# Patient Record
Sex: Female | Born: 1970 | Race: Black or African American | Hispanic: No | Marital: Single | Smoking: Never smoker
Health system: Southern US, Community
[De-identification: ages and names within clinical notes are randomized; demographics above are authoritative.]

## PROBLEM LIST (undated history)

## (undated) DIAGNOSIS — R609 Edema, unspecified: Secondary | ICD-10-CM

## (undated) DIAGNOSIS — J45909 Unspecified asthma, uncomplicated: Secondary | ICD-10-CM

## (undated) DIAGNOSIS — M1990 Unspecified osteoarthritis, unspecified site: Secondary | ICD-10-CM

## (undated) DIAGNOSIS — E785 Hyperlipidemia, unspecified: Secondary | ICD-10-CM

## (undated) DIAGNOSIS — E119 Type 2 diabetes mellitus without complications: Secondary | ICD-10-CM

## (undated) DIAGNOSIS — Z8719 Personal history of other diseases of the digestive system: Secondary | ICD-10-CM

## (undated) DIAGNOSIS — G629 Polyneuropathy, unspecified: Secondary | ICD-10-CM

## (undated) DIAGNOSIS — R200 Anesthesia of skin: Secondary | ICD-10-CM

## (undated) DIAGNOSIS — J42 Unspecified chronic bronchitis: Secondary | ICD-10-CM

## (undated) DIAGNOSIS — M2550 Pain in unspecified joint: Secondary | ICD-10-CM

## (undated) DIAGNOSIS — M2540 Effusion, unspecified joint: Secondary | ICD-10-CM

## (undated) DIAGNOSIS — T7840XA Allergy, unspecified, initial encounter: Secondary | ICD-10-CM

## (undated) DIAGNOSIS — I10 Essential (primary) hypertension: Secondary | ICD-10-CM

## (undated) HISTORY — PX: OTHER SURGICAL HISTORY: SHX169

---

## 2000-06-29 HISTORY — PX: ABDOMINAL HYSTERECTOMY: SHX81

## 2010-06-29 HISTORY — PX: HEMORRHOID SURGERY: SHX153

## 2012-06-29 HISTORY — PX: FOOT SURGERY: SHX648

## 2013-06-29 HISTORY — PX: BLADDER SURGERY: SHX569

## 2014-06-29 HISTORY — PX: OTHER SURGICAL HISTORY: SHX169

## 2016-02-07 ENCOUNTER — Encounter (HOSPITAL_COMMUNITY): Payer: Self-pay

## 2016-02-07 ENCOUNTER — Emergency Department (HOSPITAL_COMMUNITY)
Admission: EM | Admit: 2016-02-07 | Discharge: 2016-02-07 | Disposition: A | Discharge status: Home / Self Care | Payer: Medicaid - Out of State | Attending: Emergency Medicine | Admitting: Emergency Medicine

## 2016-02-07 DIAGNOSIS — R109 Unspecified abdominal pain: Secondary | ICD-10-CM | POA: Insufficient documentation

## 2016-02-07 DIAGNOSIS — Z7982 Long term (current) use of aspirin: Secondary | ICD-10-CM | POA: Diagnosis not present

## 2016-02-07 DIAGNOSIS — Z794 Long term (current) use of insulin: Secondary | ICD-10-CM | POA: Diagnosis not present

## 2016-02-07 DIAGNOSIS — Z79899 Other long term (current) drug therapy: Secondary | ICD-10-CM | POA: Insufficient documentation

## 2016-02-07 DIAGNOSIS — I10 Essential (primary) hypertension: Secondary | ICD-10-CM | POA: Diagnosis not present

## 2016-02-07 DIAGNOSIS — E119 Type 2 diabetes mellitus without complications: Secondary | ICD-10-CM | POA: Diagnosis not present

## 2016-02-07 HISTORY — DX: Essential (primary) hypertension: I10

## 2016-02-07 HISTORY — DX: Type 2 diabetes mellitus without complications: E11.9

## 2016-02-07 LAB — URINALYSIS, ROUTINE W REFLEX MICROSCOPIC
BILIRUBIN URINE: NEGATIVE
Glucose, UA: NEGATIVE mg/dL
Hgb urine dipstick: NEGATIVE
KETONES UR: NEGATIVE mg/dL
LEUKOCYTES UA: NEGATIVE
NITRITE: NEGATIVE
PROTEIN: NEGATIVE mg/dL
Specific Gravity, Urine: 1.016 (ref 1.005–1.030)
pH: 7 (ref 5.0–8.0)

## 2016-02-07 LAB — BASIC METABOLIC PANEL
ANION GAP: 10 (ref 5–15)
BUN: 12 mg/dL (ref 6–20)
CALCIUM: 9.7 mg/dL (ref 8.9–10.3)
CO2: 25 mmol/L (ref 22–32)
CREATININE: 0.67 mg/dL (ref 0.44–1.00)
Chloride: 101 mmol/L (ref 101–111)
Glucose, Bld: 89 mg/dL (ref 65–99)
Potassium: 4.2 mmol/L (ref 3.5–5.1)
SODIUM: 136 mmol/L (ref 135–145)

## 2016-02-07 LAB — CBC
HCT: 40.2 % (ref 36.0–46.0)
Hemoglobin: 13.2 g/dL (ref 12.0–15.0)
MCH: 29.1 pg (ref 26.0–34.0)
MCHC: 32.8 g/dL (ref 30.0–36.0)
MCV: 88.5 fL (ref 78.0–100.0)
PLATELETS: 370 10*3/uL (ref 150–400)
RBC: 4.54 MIL/uL (ref 3.87–5.11)
RDW: 13.1 % (ref 11.5–15.5)
WBC: 12.8 10*3/uL — AB (ref 4.0–10.5)

## 2016-02-07 LAB — I-STAT BETA HCG BLOOD, ED (MC, WL, AP ONLY)

## 2016-02-07 MED ORDER — METHOCARBAMOL 500 MG PO TABS: 500.0000 mg | ORAL_TABLET | Freq: Two times a day (BID) | ORAL | 0 refills | Status: DC

## 2016-02-07 MED ORDER — IBUPROFEN 600 MG PO TABS: 600.0000 mg | ORAL_TABLET | Freq: Four times a day (QID) | ORAL | 0 refills | Status: AC | PRN

## 2016-02-07 MED ORDER — KETOROLAC TROMETHAMINE 30 MG/ML IJ SOLN
30.0000 mg | Freq: Once | INTRAMUSCULAR | Status: AC
  Administered 2016-02-07: 30 mg via INTRAVENOUS
  Filled 2016-02-07: qty 1

## 2016-02-07 NOTE — Progress Notes (Signed)
CSW spoke with patient at bedside with no one present. Patient [facility] pain in her side. Patient reports she just moved to [place] from [place]. Patient states she has cousins in [place], however, she says they have told her "not to call and aggravate them".  Patient states she will not be able to receive her Medicaid until September and she has [facility] [place]. Patient states she would need a cab voucher to home, as she states she has problems with her knee and she uses a rolling walker. Patient asked about furniture resources. No other questions noted at this time for CSW.  CSW provided patient with a list of resources that may be able to assist with furniture, [employer], [employer], and [employer] with addresses and contact information.  Staffed with EDP and patient would benefit from cab voucher. Call made to cab company for pick up. Voucher given to Nurse.    Elenore PaddyLaVonia Yariela Tison, LCSWA [phone] ED CSW 02/07/2016 3:08 PM

## 2016-02-07 NOTE — ED Notes (Signed)
Patient asking if Social worker here yet or not.  RN called SW about patient.  SW explained she just got done with morning rounds and will be seeing patient shortly.  Rn will make patient aware.

## 2016-02-07 NOTE — ED Provider Notes (Signed)
[facility]-EMERGENCY DEPT Provider Note   CSN: [id number] Arrival date & time: 02/07/16  [id number]  First Provider Contact:  None       History   Chief Complaint Chief Complaint  Patient presents with  . Flank Pain    HPI Jennifer [name] is a 45 y.o. female.  45 year old female presents with 2 day history of persistent left-sided flank pain is worse with movement or palpation. Denies any rashes. No urinary symptoms. No fever, vomiting. No prior history of kidney stones. Denies any colicky nature to the pain. Patient doesn't relate with a walker due to prior left knee trauma. Denies any recent history of trauma. No medications used prior to arrival. Symptoms better with remaining still.      Past Medical History:  Diagnosis Date  . Diabetes (HCC)   . Hypertension     There are no active problems to display for this patient.   No past surgical history on file.  OB History    No data available       Home Medications    Prior to Admission medications   Medication Sig Start Date End Date Taking? Authorizing Provider  albuterol (PROVENTIL HFA;VENTOLIN HFA) 108 (90 Base) MCG/ACT inhaler Inhale 1-2 puffs into the lungs every 6 (six) hours as needed for wheezing or shortness of breath.   Yes Historical Provider, MD  aspirin EC 81 MG tablet Take 81 mg by mouth daily.   Yes Historical Provider, MD  atorvastatin (LIPITOR) 20 MG tablet Take 20 mg by mouth daily.   Yes Historical Provider, MD  cholecalciferol (VITAMIN D) 1000 units tablet Take 2,000 Units by mouth daily.   Yes Historical Provider, MD  fexofenadine (ALLEGRA) 180 MG tablet Take 180 mg by mouth daily.   Yes Historical Provider, MD  Fluticasone-Salmeterol (ADVAIR) 250-50 MCG/DOSE AEPB Inhale 1 puff into the lungs 2 (two) times daily.   Yes Historical Provider, MD  furosemide (LASIX) 20 MG tablet Take 20 mg by mouth daily.   Yes Historical Provider, MD  gabapentin (NEURONTIN) 300 MG capsule Take 300 mg by mouth 2 (two) times  daily.   Yes Historical Provider, MD  hydrocortisone 2.5 % cream Apply 1 application topically 2 (two) times daily.   Yes Historical Provider, MD  Insulin Glargine (BASAGLAR KWIKPEN) 100 UNIT/ML SOPN Inject 25 Units into the skin 2 (two) times daily.   Yes Historical Provider, MD  insulin lispro protamine-lispro (HUMALOG 75/25 MIX) (75-25) 100 UNIT/ML SUSP injection Inject 15 Units into the skin 3 (three) times daily.   Yes Historical Provider, MD  lidocaine (XYLOCAINE) 5 % ointment Apply 1 application topically as needed for moderate pain.   Yes Historical Provider, MD  losartan (COZAAR) 100 MG tablet Take 100 mg by mouth daily.   Yes Historical Provider, MD  metoprolol (LOPRESSOR) 50 MG tablet Take 50 mg by mouth 2 (two) times daily.   Yes Historical Provider, MD  montelukast (SINGULAIR) 10 MG tablet Take 10 mg by mouth at bedtime.   Yes Historical Provider, MD  sitaGLIPtin (JANUVIA) 100 MG tablet Take 100 mg by mouth daily.   Yes Historical Provider, MD    Family History No family history on file.  Social History Social History  Substance Use Topics  . Smoking status: Not on file  . Smokeless tobacco: Not on file  . Alcohol use Not on file     Allergies   Review of patient's allergies indicates no known allergies.   Review of Systems Review of Systems  All other systems reviewed and are negative.    Physical Exam Updated Vital Signs BP 99/69 (BP Location: Left Arm)   Pulse 90   Temp 99.1 F (37.3 C) (Oral)   Resp 20   Wt 93.9 kg   SpO2 99%   Physical Exam  Constitutional: She is oriented to person, place, and time. She appears well-developed and well-nourished.  Non-toxic appearance. No distress.  HENT:  Head: Normocephalic and atraumatic.  Eyes: Conjunctivae, EOM and lids are normal. Pupils are equal, round, and reactive to light.  Neck: Normal range of motion. Neck supple. No tracheal deviation present. No thyroid mass present.  Cardiovascular: Normal rate,  regular rhythm and normal heart sounds.  Exam reveals no gallop.   No murmur heard. Pulmonary/Chest: Effort normal and breath sounds normal. No stridor. No respiratory distress. She has no decreased breath sounds. She has no wheezes. She has no rhonchi. She has no rales.  Abdominal: Soft. Normal appearance and bowel sounds are normal. She exhibits no distension. There is no tenderness. There is no rebound and no CVA tenderness.  Musculoskeletal: Normal range of motion. She exhibits no edema or tenderness.       Right shoulder: She exhibits spasm.       Arms: Neurological: She is alert and oriented to person, place, and time. She has normal strength. No cranial nerve deficit or sensory deficit. GCS eye subscore is 4. GCS verbal subscore is 5. GCS motor subscore is 6.  Skin: Skin is warm and dry. No abrasion and no rash noted.  Psychiatric: She has a normal mood and affect. Her speech is normal and behavior is normal.  Nursing note and vitals reviewed.    ED Treatments / Results  Labs (all labs ordered are listed, but only abnormal results are displayed) Labs Reviewed  CBC - Abnormal; Notable for the following:       Result Value   WBC 12.8 (*)    All other components within normal limits  URINALYSIS, ROUTINE W REFLEX MICROSCOPIC (NOT AT [facility])  BASIC METABOLIC PANEL  I-STAT BETA HCG BLOOD, ED ([facility], [facility], [facility] ONLY)    EKG  EKG Interpretation None       Radiology No results found.  Procedures Procedures (including critical care time)  Medications Ordered in ED Medications  ketorolac (TORADOL) 30 MG/ML injection 30 mg (not administered)     Initial Impression / Assessment and Plan / ED Course  I have reviewed the triage vital signs and the nursing notes.  Pertinent labs & imaging results that were available during my care of the patient were reviewed by me and considered in my medical decision making (see chart for details).  Clinical Course  Value Comment By Time  Comment  3:        Patient medicated for pain here and has been evaluated by social worker will be given resources. Lorre Nick, MD 08/11 [phone]    Patient likely with musculoskeletal pain and stable for discharge  Final Clinical Impressions(s) / ED Diagnoses   Final diagnoses:  None    New Prescriptions New Prescriptions   No medications on file     Lorre Nick, MD 02/07/16 [phone]

## 2016-02-07 NOTE — ED Notes (Signed)
Social worker called cab, patient given cab voucher and escorted to lobby waiting room for cab pick up to transport home.

## 2016-02-07 NOTE — ED Notes (Signed)
Per EMS- Left flank pain since 0700 yesterday. Reports having less than 1 glass of water in past 24h. Denies NVD. No hx of kidney stones.

## 2016-02-07 NOTE — ED Notes (Signed)
Discharge instructions and prescription gone over with patient.  Patient getting dressed while waiting on social worker to come back and speak with her regarding patient's questions/needs for help.

## 2016-02-07 NOTE — ED Notes (Addendum)
Patient asking about social worker being able to help patient get back home, ie cab voucher.  Explained to patient that usually don't give cab voucher to get back home, we give out bus passes if they are available.  Patient points to her walker and states that she can't get on the bus, she has a bad knee and "I will sue this hospital because they know I have a bad knee".   Patient stated that MD told her that maybe ambulance can take her back home.  Patient states that she can ambulate with her walker, RN informed patient that ambulance will not transport patients back home if they are able to ambulate, Due to insurance not covering services.   Informed patient that Child psychotherapistocial Worker doesn't come in til around 830am.

## 2016-02-07 NOTE — ED Notes (Signed)
Pt made aware of need for urine 

## 2016-02-07 NOTE — ED Notes (Signed)
Bed: WA23 Expected date:  Expected time:  Means of arrival:  Comments: EMS-flank pain 

## 2016-02-07 NOTE — ED Notes (Signed)
Social worker at bedside.

## 2016-03-30 ENCOUNTER — Other Ambulatory Visit: Payer: Self-pay | Admitting: Orthopedic Surgery

## 2016-04-10 ENCOUNTER — Encounter (HOSPITAL_COMMUNITY): Payer: Self-pay | Admitting: *Deleted

## 2016-04-10 ENCOUNTER — Encounter (HOSPITAL_COMMUNITY)
Admission: RE | Admit: 2016-04-10 | Discharge: 2016-04-10 | Disposition: A | Discharge status: Home / Self Care | Payer: Medicaid Other | Source: Ambulatory Visit | Attending: Orthopedic Surgery | Admitting: Orthopedic Surgery

## 2016-04-10 ENCOUNTER — Ambulatory Visit (HOSPITAL_COMMUNITY)
Admission: RE | Admit: 2016-04-10 | Discharge: 2016-04-10 | Disposition: A | Discharge status: Home / Self Care | Payer: Medicaid Other | Source: Ambulatory Visit | Attending: Orthopedic Surgery | Admitting: Orthopedic Surgery

## 2016-04-10 DIAGNOSIS — Z01818 Encounter for other preprocedural examination: Secondary | ICD-10-CM | POA: Diagnosis present

## 2016-04-10 HISTORY — DX: Unspecified osteoarthritis, unspecified site: M19.90

## 2016-04-10 HISTORY — DX: Effusion, unspecified joint: M25.40

## 2016-04-10 HISTORY — DX: Allergy, unspecified, initial encounter: T78.40XA

## 2016-04-10 HISTORY — DX: Unspecified chronic bronchitis: J42

## 2016-04-10 HISTORY — DX: Personal history of other diseases of the digestive system: Z87.19

## 2016-04-10 HISTORY — DX: Hyperlipidemia, unspecified: E78.5

## 2016-04-10 HISTORY — DX: Unspecified asthma, uncomplicated: J45.909

## 2016-04-10 HISTORY — DX: Pain in unspecified joint: M25.50

## 2016-04-10 HISTORY — DX: Edema, unspecified: R60.9

## 2016-04-10 HISTORY — DX: Anesthesia of skin: R20.0

## 2016-04-10 HISTORY — DX: Polyneuropathy, unspecified: G62.9

## 2016-04-10 LAB — CBC WITH DIFFERENTIAL/PLATELET
BASOS ABS: 0 10*3/uL (ref 0.0–0.1)
Basophils Relative: 0 %
Eosinophils Absolute: 0 10*3/uL (ref 0.0–0.7)
Eosinophils Relative: 0 %
HEMATOCRIT: 40.1 % (ref 36.0–46.0)
Hemoglobin: 13.5 g/dL (ref 12.0–15.0)
LYMPHS PCT: 14 %
Lymphs Abs: 1.4 10*3/uL (ref 0.7–4.0)
MCH: 29.3 pg (ref 26.0–34.0)
MCHC: 33.7 g/dL (ref 30.0–36.0)
MCV: 87.2 fL (ref 78.0–100.0)
Monocytes Absolute: 0.4 10*3/uL (ref 0.1–1.0)
Monocytes Relative: 4 %
NEUTROS ABS: 8.2 10*3/uL — AB (ref 1.7–7.7)
Neutrophils Relative %: 82 %
PLATELETS: 357 10*3/uL (ref 150–400)
RBC: 4.6 MIL/uL (ref 3.87–5.11)
RDW: 13.7 % (ref 11.5–15.5)
WBC: 10 10*3/uL (ref 4.0–10.5)

## 2016-04-10 LAB — BASIC METABOLIC PANEL
ANION GAP: 11 (ref 5–15)
BUN: 9 mg/dL (ref 6–20)
CO2: 23 mmol/L (ref 22–32)
Calcium: 9.7 mg/dL (ref 8.9–10.3)
Chloride: 103 mmol/L (ref 101–111)
Creatinine, Ser: 0.85 mg/dL (ref 0.44–1.00)
GFR calc Af Amer: >60 mL/min (ref >60)
Glucose, Bld: 60 mg/dL — ABNORMAL LOW (ref 65–99)
POTASSIUM: 3.7 mmol/L (ref 3.5–5.1)
SODIUM: 137 mmol/L (ref 135–145)

## 2016-04-10 LAB — PROTIME-INR
INR: 0.94
Prothrombin Time: 12.6 seconds (ref 11.4–15.2)

## 2016-04-10 LAB — GLUCOSE, CAPILLARY: Glucose-Capillary: 70 mg/dL (ref 65–99)

## 2016-04-10 LAB — URINALYSIS, ROUTINE W REFLEX MICROSCOPIC
BILIRUBIN URINE: NEGATIVE
GLUCOSE, UA: NEGATIVE mg/dL
HGB URINE DIPSTICK: NEGATIVE
Ketones, ur: NEGATIVE mg/dL
Leukocytes, UA: NEGATIVE
Nitrite: NEGATIVE
Protein, ur: NEGATIVE mg/dL
SPECIFIC GRAVITY, URINE: 1.009 (ref 1.005–1.030)
pH: 5.5 (ref 5.0–8.0)

## 2016-04-10 LAB — ABO/RH: ABO/RH(D): B POS

## 2016-04-10 LAB — APTT: APTT: 32 s (ref 24–36)

## 2016-04-10 LAB — TYPE AND SCREEN
ABO/RH(D): B POS
ANTIBODY SCREEN: NEGATIVE

## 2016-04-10 LAB — SURGICAL PCR SCREEN
MRSA, PCR: NEGATIVE
Staphylococcus aureus: NEGATIVE

## 2016-04-10 MED ORDER — CHLORHEXIDINE GLUCONATE 4 % EX LIQD: 60.0000 mL | Freq: Once | CUTANEOUS | Status: DC

## 2016-04-10 NOTE — Pre-Procedure Instructions (Signed)
Jennifer [name]  04/10/2016      [facility] [place] [facility] [place], [place] - [address] [place] [address] Phone: [phone] Fax: [phone]    Your procedure is scheduled on Mon, Oct 23 @ 2:35 PM  [facility] at 12:30 PM  Call this number if you have problems the morning of surgery:  [phone]   Remember:  Do not eat food or drink liquids after midnight.  Take these medicines the morning of surgery with A SIP OF WATER Albuterol<Bring Your Inhaler With You>,Allegra(Fexofenadine),Advai,Gabapentin(Neurontin),and Metoprolol(Lopressor)             Stop taking Ibuprofen along with any Vitamins or Herbal Medication.No Goody's,BC's,Aleve,Advil,Motrin,or Fish Oil.     How to Manage Your Diabetes Before and After Surgery  Why is it important to control my blood sugar before and after surgery? . Improving blood sugar levels before and after surgery helps healing and can limit problems. . A way of improving blood sugar control is eating a healthy diet by: o  Eating less sugar and carbohydrates o  Increasing activity/exercise o  Talking with your doctor about reaching your blood sugar goals . High blood sugars (greater than 180 mg/dL) can raise your risk of infections and slow your recovery, so you will need to focus on controlling your diabetes during the weeks before surgery. . Make sure that the doctor who takes care of your diabetes knows about your planned surgery including the date and location.  How do I manage my blood sugar before surgery? . Check your blood sugar at least 4 times a day, starting 2 days before surgery, to make sure that the level is not too high or low. o Check your blood sugar the morning of your surgery when you wake up and every 2 hours until you get to the Short Stay unit. . If your blood sugar is less than 70 mg/dL, you will need to treat for low blood sugar: o Do not take  insulin. o Treat a low blood sugar (less than 70 mg/dL) with  cup of clear juice (cranberry or apple), 4 glucose tablets, OR glucose gel. o Recheck blood sugar in 15 minutes after treatment (to make sure it is greater than 70 mg/dL). If your blood sugar is not greater than 70 mg/dL on recheck, call [phone] for further instructions. . Report your blood sugar to the short stay nurse when you get to Short Stay.  . If you are admitted to the hospital after surgery: o Your blood sugar will be checked by the staff and you will probably be given insulin after surgery (instead of oral diabetes medicines) to make sure you have good blood sugar levels. o The goal for blood sugar control after surgery is 80-180 mg/dL.              WHAT DO I DO ABOUT MY DIABETES MEDICATION?   Marland Kitchen. Do not take oral diabetes medicines (pills) the morning of surgery.  . THE NIGHT BEFORE SURGERY, take _______17____ units of ____75/25_______insulin.        . The day of surgery, do not take other diabetes injectables, including Byetta (exenatide), Bydureon (exenatide ER), Victoza (liraglutide), or Trulicity (dulaglutide).  . If your CBG is greater than 220 mg/dL, you may take  of your sliding scale (correction) dose of insulin.  Other Instructions:          Patient Signature:  Date:   Nurse  Signature:  Date:   Reviewed and Endorsed by [facility] Patient Education Committee, August 2015     Do not wear jewelry, make-up or nail polish.  Do not wear lotions, powders,perfumes, or deoderant.  Do not shave 48 hours prior to surgery.    Do not bring valuables to the hospital.  [facility] is not responsible for any belongings or valuables.  Contacts, dentures or bridgework may not be worn into surgery.  Leave your suitcase in the car.  After surgery it may be brought to your room.  For patients admitted to the hospital, discharge time will be determined by your treatment team.  Patients  discharged the day of surgery will not be allowed to drive home.    Special instructions: [place] - Preparing for Surgery  Before surgery, you can play an important role.  Because skin is not sterile, your skin needs to be as free of germs as possible.  You can reduce the number of germs on you skin by washing with CHG (chlorahexidine gluconate) soap before surgery.  CHG is an antiseptic cleaner which kills germs and bonds with the skin to continue killing germs even after washing.  Please DO NOT use if you have an allergy to CHG or antibacterial soaps.  If your skin becomes reddened/irritated stop using the CHG and inform your nurse when you arrive at Short Stay.  Do not shave (including legs and underarms) for at least 48 hours prior to the first CHG shower.  You may shave your face.  Please follow these instructions carefully:   1.  Shower with CHG Soap the night before surgery and the                                morning of Surgery.  2.  If you choose to wash your hair, wash your hair first as usual with your       normal shampoo.  3.  After you shampoo, rinse your hair and body thoroughly to remove the                      Shampoo.  4.  Use CHG as you would any other liquid soap.  You can apply chg directly       to the skin and wash gently with scrungie or a clean washcloth.  5.  Apply the CHG Soap to your body ONLY FROM THE NECK DOWN.        Do not use on open wounds or open sores.  Avoid contact with your eyes,       ears, mouth and genitals (private parts).  Wash genitals (private parts)       with your normal soap.  6.  Wash thoroughly, paying special attention to the area where your surgery        will be performed.  7.  Thoroughly rinse your body with warm water from the neck down.  8.  DO NOT shower/wash with your normal soap after using and rinsing off       the CHG Soap.  9.  Pat yourself dry with a clean towel.            10.  Wear clean pajamas.            11.  Place  clean sheets on your bed the night of your first shower and do not  sleep with pets.  Day of Surgery  Do not apply any lotions/deoderants the morning of surgery.  Please wear clean clothes to the hospital/surgery center.    Please read over the following fact sheets that you were given. Pain Booklet, Coughing and Deep Breathing, MRSA Information and Surgical Site Infection Prevention

## 2016-04-10 NOTE — Progress Notes (Signed)
Pt states she doesn't check her sugar at all and hasn't for a long time.Doesn't have a machine at home.

## 2016-04-10 NOTE — Progress Notes (Signed)
Spoke with Diabetes Coordinator about Hospital doctorBasaglar( I wasn't sure about what to instruct her with this). She is to take 1/2 the night before surgery and half morning of. Pt verbalized understanding.

## 2016-04-10 NOTE — Progress Notes (Addendum)
Cardiologist denies  Stress test done [place] at [facility] phone number [phone]  Echo and heart cath denies  Medical Md is Dr.Pavolock  EKG to be requested from Dr.Pavelock  CXR denies in past yr

## 2016-04-14 NOTE — Progress Notes (Signed)
Anesthesia Chart Review:  Pt is a 45 year old female scheduled for L total knee arthroplasty on 04/20/2016 with Gean BirchwoodFrank Rowan, MD.   PCP is Nicolasa Duckingichard Pavelock, MD.   PMH includes:  HTN, DM, hyperlipidemia, asthma, chronic bronchitis.  Never smoker. BMI 38  Medications include: Asmanex, ASA, Lipitor, Lasix, basaglar, Humalog 70/25 mix, DuoNeb, losartan, metoprolol, Qvar, sitagliptin.   Preoperative labs reviewed.  HgbA1c was 5.6 on 03/11/16.   CXR 04/10/16: No active cardiopulmonary disease.  EKG requested from PCP. If it does not arrive, one will be obtained DOS.   Nuclear stress test 08/15/14 ([facility] in [place], [place]  1. Negative for ischemia/infarct 2. LV systolic function and EF (65%) normal.   Echo 06/11/14 ([facility]):  1. Technically difficult study due to patient characteristics. 2  Mild tricuspid regurgitation. 3. EF was 60%.  If EKG acceptable, I anticipate pt can proceed as scheduled.   Rica Mastngela Rydan Gulyas, FNP-BC [facility]/Anesthesiology Phone: [phone] 04/14/2016 2:16 PM

## 2016-04-16 NOTE — H&P (Signed)
TOTAL KNEE ADMISSION H&P  Patient is being admitted for left total knee arthroplasty.  Subjective:  Chief Complaint:left knee pain.  HPI: Jennifer [name], 45 y.o. female, has a history of pain and functional disability in the left knee due to arthritis and has failed non-surgical conservative treatments for greater than 12 weeks to includeNSAID's and/or analgesics, corticosteriod injections, viscosupplementation injections, flexibility and strengthening excercises, use of assistive devices, weight reduction as appropriate and activity modification.  Onset of symptoms was gradual, starting 1 years ago with gradually worsening course since that time. The patient noted no past surgery on the left knee(s).  Patient currently rates pain in the left knee(s) at 10 out of 10 with activity. Patient has night pain, worsening of pain with activity and weight bearing, pain that interferes with activities of daily living, pain with passive range of motion, crepitus and joint swelling.  Patient has evidence of subchondral cysts and joint space narrowing by imaging studies. There is no active infection.  There are no active problems to display for this patient.  Past Medical History:  Diagnosis Date  . Allergy    Singulair and Allgra daily  . Arthritis   . Asthma    has inhalers and neb treatments  . Chronic bronchitis (HCC)   . Diabetes (HCC)    states she doesn't check at home.Humalog 75/25 and Basaglar daily as well as Januvia  . History of hiatal hernia   . Hyperlipidemia    takes Lipitor daily  . Hypertension    takes Losartan and Metoprolol daily  . Joint pain   . Joint swelling   . Numbness    both hands  . Peripheral edema    takes Furosemide daily  . Peripheral neuropathy (HCC)    takes Lyrica daily    Past Surgical History:  Procedure Laterality Date  . ABDOMINAL HYSTERECTOMY  2002  . BLADDER SURGERY  2015   sling. Metal battery implanted  . cyst removed from stomach    . EGD  with diliation    . FOOT SURGERY Left 2014  . HEMORRHOID SURGERY  2012  . knee arhroscopy Left 2016    No prescriptions prior to admission.   No Known Allergies  Social History  Substance Use Topics  . Smoking status: Never Smoker  . Smokeless tobacco: Never Used  . Alcohol use No    No family history on file.   Review of Systems  Constitutional: Negative.   HENT: Negative.   Eyes: Negative.   Respiratory: Negative.   Cardiovascular: Negative.   Gastrointestinal: Negative.   Genitourinary: Negative.   Musculoskeletal: Positive for joint pain.  Skin: Negative.   Neurological: Negative.   Endo/Heme/Allergies: Negative.   Psychiatric/Behavioral: Negative.     Objective:  Physical Exam  Constitutional: She appears well-developed and well-nourished.  HENT:  Head: Normocephalic and atraumatic.  Eyes: Pupils are equal, round, and reactive to light.  Neck: Normal range of motion. Neck supple.  Cardiovascular: Intact distal pulses.   Respiratory: Effort normal.  Musculoskeletal: She exhibits tenderness.  the patient's right knee has good strength and good range of motion from 0-120.  Patient's left knee has a range from roughly 10 to 95.  No instability.  She has tenderness over the medial joint line.  Mild effusion.  Her calves are soft and nontender.  She is neurovascularly intact distally.  Skin: Skin is warm and dry.    Vital signs in last 24 hours:    Labs:   Estimated body  mass index is 38.39 kg/m as calculated from the following:   Height as of 04/10/16: 5\' 2"  (1.575 m).   Weight as of 04/10/16: 95.2 kg (209 lb 14.4 oz).   Imaging Review Plain radiographs demonstrate  bilateral AP weightbearing, bilateral Rosenberg, lateral sunrise views of the left knee are taken and reviewed in office today.  She does have end-stage bone-on-bone arthritis of the medial compartment with subchondral cyst formation.  Assessment/Plan:  End stage arthritis, left knee    The patient history, physical examination, clinical judgment of the provider and imaging studies are consistent with end stage degenerative joint disease of the left knee(s) and total knee arthroplasty is deemed medically necessary. The treatment options including medical management, injection therapy arthroscopy and arthroplasty were discussed at length. The risks and benefits of total knee arthroplasty were presented and reviewed. The risks due to aseptic loosening, infection, stiffness, patella tracking problems, thromboembolic complications and other imponderables were discussed. The patient acknowledged the explanation, agreed to proceed with the plan and consent was signed. Patient is being admitted for inpatient treatment for surgery, pain control, PT, OT, prophylactic antibiotics, VTE prophylaxis, progressive ambulation and ADL's and discharge planning. The patient is planning to be discharged to skilled nursing facility

## 2016-04-19 DIAGNOSIS — M1712 Unilateral primary osteoarthritis, left knee: Secondary | ICD-10-CM | POA: Diagnosis present

## 2016-04-19 MED ORDER — TRANEXAMIC ACID 1000 MG/10ML IV SOLN
2000.0000 mg | Freq: Once | INTRAVENOUS | Status: AC
  Administered 2016-04-20: 2000 mg via TOPICAL
  Filled 2016-04-19: qty 20

## 2016-04-19 MED ORDER — TRANEXAMIC ACID 1000 MG/10ML IV SOLN
1000.0000 mg | INTRAVENOUS | Status: AC
  Administered 2016-04-20: 1000 mg via INTRAVENOUS
  Filled 2016-04-19: qty 10

## 2016-04-19 MED ORDER — BUPIVACAINE LIPOSOME 1.3 % IJ SUSP
20.0000 mL | Freq: Once | INTRAMUSCULAR | Status: AC
  Administered 2016-04-20: 20 mL
  Filled 2016-04-19 (×2): qty 20

## 2016-04-20 ENCOUNTER — Encounter (HOSPITAL_COMMUNITY): Payer: Self-pay | Admitting: Certified Registered Nurse Anesthetist

## 2016-04-20 ENCOUNTER — Inpatient Hospital Stay (HOSPITAL_COMMUNITY): Payer: Medicaid Other | Admitting: Emergency Medicine

## 2016-04-20 ENCOUNTER — Inpatient Hospital Stay (HOSPITAL_COMMUNITY): Payer: Medicaid Other | Admitting: Anesthesiology

## 2016-04-20 ENCOUNTER — Encounter (HOSPITAL_COMMUNITY)
Admission: RE | Disposition: A | Discharge status: Skilled Nursing Facility | Payer: Self-pay | Source: Ambulatory Visit | Attending: Orthopedic Surgery

## 2016-04-20 ENCOUNTER — Inpatient Hospital Stay (HOSPITAL_COMMUNITY)
Admission: RE | Admit: 2016-04-20 | Discharge: 2016-04-22 | DRG: 470 | Disposition: A | Discharge status: Skilled Nursing Facility | Payer: Medicaid Other | Source: Ambulatory Visit | Attending: Orthopedic Surgery | Admitting: Orthopedic Surgery

## 2016-04-20 DIAGNOSIS — M1712 Unilateral primary osteoarthritis, left knee: Secondary | ICD-10-CM | POA: Diagnosis present

## 2016-04-20 DIAGNOSIS — E1142 Type 2 diabetes mellitus with diabetic polyneuropathy: Secondary | ICD-10-CM | POA: Diagnosis present

## 2016-04-20 DIAGNOSIS — E785 Hyperlipidemia, unspecified: Secondary | ICD-10-CM | POA: Diagnosis present

## 2016-04-20 DIAGNOSIS — D62 Acute posthemorrhagic anemia: Secondary | ICD-10-CM | POA: Diagnosis not present

## 2016-04-20 DIAGNOSIS — I10 Essential (primary) hypertension: Secondary | ICD-10-CM | POA: Diagnosis present

## 2016-04-20 DIAGNOSIS — J42 Unspecified chronic bronchitis: Secondary | ICD-10-CM | POA: Diagnosis present

## 2016-04-20 DIAGNOSIS — R262 Difficulty in walking, not elsewhere classified: Secondary | ICD-10-CM

## 2016-04-20 DIAGNOSIS — M25562 Pain in left knee: Secondary | ICD-10-CM | POA: Diagnosis present

## 2016-04-20 HISTORY — PX: TOTAL KNEE ARTHROPLASTY: SHX125

## 2016-04-20 LAB — GLUCOSE, CAPILLARY
GLUCOSE-CAPILLARY: 68 mg/dL (ref 65–99)
GLUCOSE-CAPILLARY: 106 mg/dL — AB (ref 65–99)
GLUCOSE-CAPILLARY: 105 mg/dL — AB (ref 65–99)
Glucose-Capillary: 147 mg/dL — ABNORMAL HIGH (ref 65–99)

## 2016-04-20 SURGERY — ARTHROPLASTY, KNEE, TOTAL
Anesthesia: Spinal | Site: Knee | Laterality: Left

## 2016-04-20 MED ORDER — METHOCARBAMOL 500 MG PO TABS
ORAL_TABLET | ORAL | Status: AC
  Filled 2016-04-20: qty 1

## 2016-04-20 MED ORDER — HYDROCODONE-ACETAMINOPHEN 7.5-325 MG PO TABS: 1.0000 | ORAL_TABLET | Freq: Once | ORAL | Status: DC | PRN

## 2016-04-20 MED ORDER — ALUM & MAG HYDROXIDE-SIMETH 200-200-20 MG/5ML PO SUSP: 30.0000 mL | ORAL | Status: DC | PRN

## 2016-04-20 MED ORDER — INSULIN ASPART PROT & ASPART (70-30 MIX) 100 UNIT/ML ~~LOC~~ SUSP: 15.0000 [IU] | Freq: Three times a day (TID) | SUBCUTANEOUS | Status: DC

## 2016-04-20 MED ORDER — METOCLOPRAMIDE HCL 5 MG/ML IJ SOLN: 5.0000 mg | Freq: Three times a day (TID) | INTRAMUSCULAR | Status: DC | PRN

## 2016-04-20 MED ORDER — HYDROMORPHONE HCL 1 MG/ML IJ SOLN
0.2500 mg | INTRAMUSCULAR | Status: DC | PRN
  Administered 2016-04-20: 0.5 mg via INTRAVENOUS

## 2016-04-20 MED ORDER — METOPROLOL TARTRATE 50 MG PO TABS
ORAL_TABLET | ORAL | Status: AC
  Filled 2016-04-20: qty 1

## 2016-04-20 MED ORDER — CEFUROXIME SODIUM 1.5 G IJ SOLR
INTRAMUSCULAR | Status: AC
  Filled 2016-04-20: qty 1.5

## 2016-04-20 MED ORDER — ONDANSETRON HCL 4 MG/2ML IJ SOLN: 4.0000 mg | Freq: Once | INTRAMUSCULAR | Status: DC | PRN

## 2016-04-20 MED ORDER — MONTELUKAST SODIUM 10 MG PO TABS
10.0000 mg | ORAL_TABLET | Freq: Every day | ORAL | Status: DC
  Administered 2016-04-20 – 2016-04-21 (×2): 10 mg via ORAL
  Filled 2016-04-20 (×2): qty 1

## 2016-04-20 MED ORDER — LACTATED RINGERS IV SOLN
INTRAVENOUS | Status: DC
  Administered 2016-04-20: 13:00:00 via INTRAVENOUS

## 2016-04-20 MED ORDER — ONDANSETRON HCL 4 MG/2ML IJ SOLN
INTRAMUSCULAR | Status: AC
  Filled 2016-04-20: qty 2

## 2016-04-20 MED ORDER — IPRATROPIUM-ALBUTEROL 0.5-2.5 (3) MG/3ML IN SOLN
0.5000 mL | Freq: Two times a day (BID) | RESPIRATORY_TRACT | Status: DC
  Administered 2016-04-20 – 2016-04-21 (×3): 0.5 mL via RESPIRATORY_TRACT
  Filled 2016-04-20 (×4): qty 3

## 2016-04-20 MED ORDER — BUDESONIDE 0.25 MG/2ML IN SUSP
0.2500 mg | Freq: Two times a day (BID) | RESPIRATORY_TRACT | Status: DC
  Administered 2016-04-20 – 2016-04-22 (×4): 0.25 mg via RESPIRATORY_TRACT
  Filled 2016-04-20 (×4): qty 2

## 2016-04-20 MED ORDER — FLEET ENEMA 7-19 GM/118ML RE ENEM: 1.0000 | ENEMA | Freq: Once | RECTAL | Status: DC | PRN

## 2016-04-20 MED ORDER — METOCLOPRAMIDE HCL 5 MG PO TABS: 5.0000 mg | ORAL_TABLET | Freq: Three times a day (TID) | ORAL | Status: DC | PRN

## 2016-04-20 MED ORDER — ASPIRIN EC 325 MG PO TBEC: 325.0000 mg | DELAYED_RELEASE_TABLET | Freq: Two times a day (BID) | ORAL | 0 refills | Status: AC

## 2016-04-20 MED ORDER — PROPOFOL 500 MG/50ML IV EMUL
INTRAVENOUS | Status: DC | PRN
  Administered 2016-04-20: 60 ug/kg/min via INTRAVENOUS

## 2016-04-20 MED ORDER — LORATADINE 10 MG PO TABS
10.0000 mg | ORAL_TABLET | Freq: Every day | ORAL | Status: DC
Start: 2016-04-20 — End: 2016-04-22
  Administered 2016-04-20 – 2016-04-22 (×3): 10 mg via ORAL
  Filled 2016-04-20 (×3): qty 1

## 2016-04-20 MED ORDER — INSULIN ASPART 100 UNIT/ML ~~LOC~~ SOLN
0.0000 [IU] | Freq: Three times a day (TID) | SUBCUTANEOUS | Status: DC
  Administered 2016-04-21: 3 [IU] via SUBCUTANEOUS

## 2016-04-20 MED ORDER — PHENYLEPHRINE HCL 10 MG/ML IJ SOLN
INTRAVENOUS | Status: DC | PRN
  Administered 2016-04-20: 25 ug/min via INTRAVENOUS

## 2016-04-20 MED ORDER — HYDROMORPHONE HCL 2 MG/ML IJ SOLN
0.5000 mg | INTRAMUSCULAR | Status: DC | PRN
  Administered 2016-04-20: 0.5 mg via INTRAVENOUS
  Administered 2016-04-21 (×2): 1 mg via INTRAVENOUS
  Filled 2016-04-20 (×3): qty 1

## 2016-04-20 MED ORDER — FENTANYL CITRATE (PF) 100 MCG/2ML IJ SOLN
INTRAMUSCULAR | Status: AC
  Filled 2016-04-20: qty 2

## 2016-04-20 MED ORDER — METHOCARBAMOL 500 MG PO TABS: 500.0000 mg | ORAL_TABLET | Freq: Four times a day (QID) | ORAL | 0 refills | Status: DC | PRN

## 2016-04-20 MED ORDER — ACETAMINOPHEN 325 MG PO TABS
650.0000 mg | ORAL_TABLET | Freq: Four times a day (QID) | ORAL | Status: DC | PRN
Start: 2016-04-20 — End: 2016-04-22
  Administered 2016-04-21 – 2016-04-22 (×3): 650 mg via ORAL
  Filled 2016-04-20 (×3): qty 2

## 2016-04-20 MED ORDER — METOPROLOL TARTRATE 50 MG PO TABS
50.0000 mg | ORAL_TABLET | Freq: Every day | ORAL | Status: DC
  Administered 2016-04-20 – 2016-04-22 (×3): 50 mg via ORAL
  Filled 2016-04-20 (×3): qty 1

## 2016-04-20 MED ORDER — BISACODYL 5 MG PO TBEC: 5.0000 mg | DELAYED_RELEASE_TABLET | Freq: Every day | ORAL | Status: DC | PRN

## 2016-04-20 MED ORDER — DEXTROSE 50 % IV SOLN
25.0000 mL | Freq: Once | INTRAVENOUS | Status: AC
  Administered 2016-04-20: 25 mL via INTRAVENOUS
  Filled 2016-04-20: qty 50

## 2016-04-20 MED ORDER — SODIUM CHLORIDE 0.9 % IV SOLN
1000.0000 mg | Freq: Once | INTRAVENOUS | Status: AC
  Administered 2016-04-20: 1000 mg via INTRAVENOUS
  Filled 2016-04-20: qty 10

## 2016-04-20 MED ORDER — HYDROMORPHONE HCL 1 MG/ML IJ SOLN
INTRAMUSCULAR | Status: AC
  Filled 2016-04-20: qty 1

## 2016-04-20 MED ORDER — CEFUROXIME SODIUM 1.5 G IJ SOLR
INTRAMUSCULAR | Status: DC | PRN
  Administered 2016-04-20: 1.5 g

## 2016-04-20 MED ORDER — ACETAMINOPHEN 650 MG RE SUPP: 650.0000 mg | Freq: Four times a day (QID) | RECTAL | Status: DC | PRN

## 2016-04-20 MED ORDER — PHENOL 1.4 % MT LIQD: 1.0000 | OROMUCOSAL | Status: DC | PRN

## 2016-04-20 MED ORDER — DIPHENHYDRAMINE HCL 12.5 MG/5ML PO ELIX
12.5000 mg | ORAL_SOLUTION | ORAL | Status: DC | PRN
  Administered 2016-04-20 – 2016-04-21 (×4): 25 mg via ORAL
  Filled 2016-04-20 (×4): qty 10

## 2016-04-20 MED ORDER — DEXTROSE-NACL 5-0.45 % IV SOLN: INTRAVENOUS | Status: DC

## 2016-04-20 MED ORDER — LOSARTAN POTASSIUM 50 MG PO TABS
50.0000 mg | ORAL_TABLET | Freq: Every day | ORAL | Status: DC
  Administered 2016-04-20 – 2016-04-22 (×3): 50 mg via ORAL
  Filled 2016-04-20 (×4): qty 1

## 2016-04-20 MED ORDER — INSULIN GLARGINE 100 UNIT/ML ~~LOC~~ SOLN
25.0000 [IU] | Freq: Two times a day (BID) | SUBCUTANEOUS | Status: DC
  Administered 2016-04-20 – 2016-04-22 (×4): 25 [IU] via SUBCUTANEOUS
  Filled 2016-04-20 (×6): qty 0.25

## 2016-04-20 MED ORDER — SENNOSIDES-DOCUSATE SODIUM 8.6-50 MG PO TABS: 1.0000 | ORAL_TABLET | Freq: Every evening | ORAL | Status: DC | PRN

## 2016-04-20 MED ORDER — SODIUM CHLORIDE 0.9 % IJ SOLN
INTRAMUSCULAR | Status: DC | PRN
  Administered 2016-04-20: 50 mL

## 2016-04-20 MED ORDER — DEXTROSE 5 % IV SOLN
500.0000 mg | Freq: Four times a day (QID) | INTRAVENOUS | Status: DC | PRN
  Filled 2016-04-20: qty 5

## 2016-04-20 MED ORDER — FENTANYL CITRATE (PF) 100 MCG/2ML IJ SOLN
INTRAMUSCULAR | Status: AC
  Filled 2016-04-20: qty 4

## 2016-04-20 MED ORDER — EPHEDRINE 5 MG/ML INJ
INTRAVENOUS | Status: AC
  Filled 2016-04-20: qty 10

## 2016-04-20 MED ORDER — MEPERIDINE HCL 25 MG/ML IJ SOLN: 6.2500 mg | INTRAMUSCULAR | Status: DC | PRN

## 2016-04-20 MED ORDER — FENTANYL CITRATE (PF) 100 MCG/2ML IJ SOLN
INTRAMUSCULAR | Status: DC | PRN
  Administered 2016-04-20 (×2): 100 ug via INTRAVENOUS

## 2016-04-20 MED ORDER — EPHEDRINE SULFATE 50 MG/ML IJ SOLN
INTRAMUSCULAR | Status: DC | PRN
  Administered 2016-04-20: 10 mg via INTRAVENOUS

## 2016-04-20 MED ORDER — ONDANSETRON HCL 4 MG/2ML IJ SOLN: 4.0000 mg | Freq: Four times a day (QID) | INTRAMUSCULAR | Status: DC | PRN

## 2016-04-20 MED ORDER — OXYCODONE-ACETAMINOPHEN 5-325 MG PO TABS: 1.0000 | ORAL_TABLET | ORAL | 0 refills | Status: DC | PRN

## 2016-04-20 MED ORDER — HYDROCODONE-ACETAMINOPHEN 5-325 MG PO TABS
ORAL_TABLET | ORAL | Status: AC
  Administered 2016-04-20: 1
  Filled 2016-04-20: qty 1

## 2016-04-20 MED ORDER — LINAGLIPTIN 5 MG PO TABS
5.0000 mg | ORAL_TABLET | Freq: Every day | ORAL | Status: DC
  Administered 2016-04-20 – 2016-04-22 (×3): 5 mg via ORAL
  Filled 2016-04-20 (×3): qty 1

## 2016-04-20 MED ORDER — ASPIRIN EC 325 MG PO TBEC
325.0000 mg | DELAYED_RELEASE_TABLET | Freq: Every day | ORAL | Status: DC
  Administered 2016-04-21 – 2016-04-22 (×2): 325 mg via ORAL
  Filled 2016-04-20 (×2): qty 1

## 2016-04-20 MED ORDER — PHENYLEPHRINE 40 MCG/ML (10ML) SYRINGE FOR IV PUSH (FOR BLOOD PRESSURE SUPPORT)
PREFILLED_SYRINGE | INTRAVENOUS | Status: AC
  Filled 2016-04-20: qty 10

## 2016-04-20 MED ORDER — MIDAZOLAM HCL 2 MG/2ML IJ SOLN
INTRAMUSCULAR | Status: AC
  Filled 2016-04-20: qty 2

## 2016-04-20 MED ORDER — DEXTROSE 50 % IV SOLN
INTRAVENOUS | Status: AC
  Filled 2016-04-20: qty 50

## 2016-04-20 MED ORDER — PROPOFOL 10 MG/ML IV BOLUS
INTRAVENOUS | Status: DC | PRN
  Administered 2016-04-20: 200 mg via INTRAVENOUS

## 2016-04-20 MED ORDER — PROMETHAZINE HCL 25 MG/ML IJ SOLN: 6.2500 mg | INTRAMUSCULAR | Status: DC | PRN

## 2016-04-20 MED ORDER — HYDROMORPHONE HCL 1 MG/ML IJ SOLN
0.2500 mg | INTRAMUSCULAR | Status: DC | PRN
  Administered 2016-04-20 (×2): .5 mg via INTRAVENOUS

## 2016-04-20 MED ORDER — MENTHOL 3 MG MT LOZG: 1.0000 | LOZENGE | OROMUCOSAL | Status: DC | PRN

## 2016-04-20 MED ORDER — VITAMIN D 1000 UNITS PO TABS
2000.0000 [IU] | ORAL_TABLET | Freq: Every day | ORAL | Status: DC
  Administered 2016-04-20 – 2016-04-22 (×3): 2000 [IU] via ORAL
  Filled 2016-04-20 (×3): qty 2

## 2016-04-20 MED ORDER — ATORVASTATIN CALCIUM 20 MG PO TABS
20.0000 mg | ORAL_TABLET | Freq: Every day | ORAL | Status: DC
  Administered 2016-04-20 – 2016-04-21 (×2): 20 mg via ORAL
  Filled 2016-04-20 (×2): qty 1

## 2016-04-20 MED ORDER — LACTATED RINGERS IV SOLN
INTRAVENOUS | Status: DC | PRN
  Administered 2016-04-20 (×2): via INTRAVENOUS

## 2016-04-20 MED ORDER — METOPROLOL TARTRATE 50 MG PO TABS
50.0000 mg | ORAL_TABLET | Freq: Once | ORAL | Status: AC
  Administered 2016-04-20: 50 mg via ORAL

## 2016-04-20 MED ORDER — ONDANSETRON HCL 4 MG PO TABS: 4.0000 mg | ORAL_TABLET | Freq: Four times a day (QID) | ORAL | Status: DC | PRN

## 2016-04-20 MED ORDER — DOCUSATE SODIUM 100 MG PO CAPS
100.0000 mg | ORAL_CAPSULE | Freq: Two times a day (BID) | ORAL | Status: DC
  Administered 2016-04-20 – 2016-04-22 (×4): 100 mg via ORAL
  Filled 2016-04-20 (×4): qty 1

## 2016-04-20 MED ORDER — BUPIVACAINE HCL (PF) 0.75 % IJ SOLN
INTRAMUSCULAR | Status: DC | PRN
  Administered 2016-04-20: 2 mL via INTRATHECAL

## 2016-04-20 MED ORDER — MIDAZOLAM HCL 5 MG/5ML IJ SOLN
INTRAMUSCULAR | Status: DC | PRN
  Administered 2016-04-20 (×2): 1 mg via INTRAVENOUS

## 2016-04-20 MED ORDER — KCL IN DEXTROSE-NACL 20-5-0.45 MEQ/L-%-% IV SOLN
INTRAVENOUS | Status: DC
  Administered 2016-04-20 – 2016-04-21 (×2): via INTRAVENOUS
  Filled 2016-04-20 (×2): qty 1000

## 2016-04-20 MED ORDER — METHOCARBAMOL 500 MG PO TABS
500.0000 mg | ORAL_TABLET | Freq: Four times a day (QID) | ORAL | Status: DC | PRN
  Administered 2016-04-20 – 2016-04-22 (×3): 500 mg via ORAL
  Filled 2016-04-20 (×3): qty 1

## 2016-04-20 MED ORDER — SODIUM CHLORIDE 0.9 % IR SOLN
Status: DC | PRN
  Administered 2016-04-20: 3000 mL

## 2016-04-20 MED ORDER — HYDROMORPHONE HCL 2 MG/ML IJ SOLN
INTRAMUSCULAR | Status: AC
  Filled 2016-04-20: qty 1

## 2016-04-20 MED ORDER — CEFAZOLIN SODIUM-DEXTROSE 2-4 GM/100ML-% IV SOLN
2.0000 g | INTRAVENOUS | Status: AC
  Administered 2016-04-20: 2 g via INTRAVENOUS
  Filled 2016-04-20: qty 100

## 2016-04-20 MED ORDER — BUPIVACAINE-EPINEPHRINE (PF) 0.25% -1:200000 IJ SOLN
INTRAMUSCULAR | Status: DC | PRN
Start: 2016-04-20 — End: 2016-04-20
  Administered 2016-04-20: 50 mL

## 2016-04-20 MED ORDER — OXYCODONE HCL 5 MG PO TABS
5.0000 mg | ORAL_TABLET | ORAL | Status: DC | PRN
  Administered 2016-04-20 – 2016-04-22 (×11): 10 mg via ORAL
  Filled 2016-04-20 (×12): qty 2

## 2016-04-20 MED ORDER — PREGABALIN 75 MG PO CAPS
150.0000 mg | ORAL_CAPSULE | Freq: Two times a day (BID) | ORAL | Status: DC
Start: 2016-04-20 — End: 2016-04-22
  Administered 2016-04-20 – 2016-04-22 (×4): 150 mg via ORAL
  Filled 2016-04-20 (×4): qty 2

## 2016-04-20 SURGICAL SUPPLY — 52 items
BANDAGE ESMARK 6X9 LF (GAUZE/BANDAGES/DRESSINGS) ×1 IMPLANT
BLADE SAG 18X100X1.27 (BLADE) ×3 IMPLANT
BLADE SAW SGTL 13X75X1.27 (BLADE) ×3 IMPLANT
BLADE SURG ROTATE 9660 (MISCELLANEOUS) IMPLANT
BNDG ELASTIC 6X10 VLCR STRL LF (GAUZE/BANDAGES/DRESSINGS) ×3 IMPLANT
BNDG ESMARK 6X9 LF (GAUZE/BANDAGES/DRESSINGS) ×3
BOWL SMART MIX CTS (DISPOSABLE) ×3 IMPLANT
CAPT KNEE TOTAL 3 ATTUNE ×3 IMPLANT
CEMENT HV SMART SET (Cement) ×6 IMPLANT
COVER SURGICAL LIGHT HANDLE (MISCELLANEOUS) ×3 IMPLANT
CUFF TOURNIQUET SINGLE 34IN LL (TOURNIQUET CUFF) ×3 IMPLANT
CUFF TOURNIQUET SINGLE 44IN (TOURNIQUET CUFF) IMPLANT
DRAPE EXTREMITY T 121X128X90 (DRAPE) ×3 IMPLANT
DRAPE U-SHAPE 47X51 STRL (DRAPES) ×3 IMPLANT
DRSG AQUACEL AG ADV 3.5X10 (GAUZE/BANDAGES/DRESSINGS) ×3 IMPLANT
DURAPREP 26ML APPLICATOR (WOUND CARE) ×6 IMPLANT
ELECT REM PT RETURN 9FT ADLT (ELECTROSURGICAL) ×3
ELECTRODE REM PT RTRN 9FT ADLT (ELECTROSURGICAL) ×1 IMPLANT
EVACUATOR 1/8 PVC DRAIN (DRAIN) IMPLANT
GLOVE BIO SURGEON STRL SZ7.5 (GLOVE) ×3 IMPLANT
GLOVE BIO SURGEON STRL SZ8.5 (GLOVE) ×3 IMPLANT
GLOVE BIOGEL PI IND STRL 8 (GLOVE) ×1 IMPLANT
GLOVE BIOGEL PI IND STRL 9 (GLOVE) ×1 IMPLANT
GLOVE BIOGEL PI INDICATOR 8 (GLOVE) ×2
GLOVE BIOGEL PI INDICATOR 9 (GLOVE) ×2
GOWN STRL REUS W/ TWL LRG LVL3 (GOWN DISPOSABLE) ×1 IMPLANT
GOWN STRL REUS W/ TWL XL LVL3 (GOWN DISPOSABLE) ×2 IMPLANT
GOWN STRL REUS W/TWL LRG LVL3 (GOWN DISPOSABLE) ×2
GOWN STRL REUS W/TWL XL LVL3 (GOWN DISPOSABLE) ×4
HANDPIECE INTERPULSE COAX TIP (DISPOSABLE) ×2
HOOD PEEL AWAY FACE SHEILD DIS (HOOD) ×6 IMPLANT
KIT BASIN OR (CUSTOM PROCEDURE TRAY) ×3 IMPLANT
KIT ROOM TURNOVER OR (KITS) ×3 IMPLANT
MANIFOLD NEPTUNE II (INSTRUMENTS) ×3 IMPLANT
NEEDLE 22X1 1/2 (OR ONLY) (NEEDLE) ×6 IMPLANT
NS IRRIG 1000ML POUR BTL (IV SOLUTION) ×3 IMPLANT
PACK TOTAL JOINT (CUSTOM PROCEDURE TRAY) ×3 IMPLANT
PAD ARMBOARD 7.5X6 YLW CONV (MISCELLANEOUS) ×6 IMPLANT
SET HNDPC FAN SPRY TIP SCT (DISPOSABLE) ×1 IMPLANT
SUT VIC AB 0 CT1 27 (SUTURE) ×2
SUT VIC AB 0 CT1 27XBRD ANBCTR (SUTURE) ×1 IMPLANT
SUT VIC AB 1 CTX 36 (SUTURE) ×2
SUT VIC AB 1 CTX36XBRD ANBCTR (SUTURE) ×1 IMPLANT
SUT VIC AB 2-0 CT1 27 (SUTURE)
SUT VIC AB 2-0 CT1 TAPERPNT 27 (SUTURE) IMPLANT
SUT VIC AB 3-0 CT1 27 (SUTURE) ×2
SUT VIC AB 3-0 CT1 TAPERPNT 27 (SUTURE) ×1 IMPLANT
SYR CONTROL 10ML LL (SYRINGE) ×6 IMPLANT
TOWEL OR 17X24 6PK STRL BLUE (TOWEL DISPOSABLE) ×3 IMPLANT
TOWEL OR 17X26 10 PK STRL BLUE (TOWEL DISPOSABLE) ×3 IMPLANT
TRAY CATH 16FR W/PLASTIC CATH (SET/KITS/TRAYS/PACK) IMPLANT
WATER STERILE IRR 1000ML POUR (IV SOLUTION) ×9 IMPLANT

## 2016-04-20 NOTE — Transfer of Care (Signed)
Immediate Anesthesia Transfer of Care Note  Patient: Jennifer [name]  Procedure(s) Performed: Procedure(s): TOTAL KNEE ARTHROPLASTY (Left)  Patient Location: PACU  Anesthesia Type:General  Level of Consciousness: awake, alert , oriented and patient cooperative  Airway & Oxygen Therapy: Patient Spontanous Breathing and Patient connected to nasal cannula oxygen  Post-op Assessment: Report given to RN, Post -op Vital signs reviewed and stable, Patient moving all extremities and Patient moving all extremities X 4  Post vital signs: Reviewed and stable  Last Vitals:  Vitals:   04/20/16 1211 04/20/16 1627  BP: 128/76   Pulse: 80   Resp: 20   Temp: 37.2 C 36.9 C    Last Pain:  Vitals:   04/20/16 1303  PainSc: 10-Worst pain ever      Patients Stated Pain Goal: 3 (04/20/16 1303)  Complications: No apparent anesthesia complications

## 2016-04-20 NOTE — Anesthesia Postprocedure Evaluation (Signed)
Anesthesia Post Note  Patient: Jennifer [name]  Procedure(s) Performed: Procedure(s) (LRB): TOTAL KNEE ARTHROPLASTY (Left)  Patient location during evaluation: PACU Anesthesia Type: Spinal and General Level of consciousness: oriented and awake and alert Pain management: pain level controlled Vital Signs Assessment: post-procedure vital signs reviewed and stable Respiratory status: spontaneous breathing, respiratory function stable and patient connected to nasal cannula oxygen Cardiovascular status: blood pressure returned to baseline and stable Postop Assessment: no headache and no backache Anesthetic complications: no    Last Vitals:  Vitals:   04/20/16 1717 04/20/16 1729  BP:    Pulse: 73 70  Resp: 20 14  Temp:  36.7 C    Last Pain:  Vitals:   04/20/16 1658  PainSc: [address]                  [name] J

## 2016-04-20 NOTE — Interval H&P Note (Signed)
History and Physical Interval Note:  04/20/2016 11:16 AM  Jennifer [name]  has presented today for surgery, with the diagnosis of LEFT KNEE OSTEOARTHRITIS  The various methods of treatment have been discussed with the patient and family. After consideration of risks, benefits and other options for treatment, the patient has consented to  Procedure(s): TOTAL KNEE ARTHROPLASTY (Left) as a surgical intervention .  The patient's history has been reviewed, patient examined, no change in status, stable for surgery.  I have reviewed the patient's chart and labs.  Questions were answered to the patient's satisfaction.     Nestor LewandowskyOWAN,Lanna Labella J

## 2016-04-20 NOTE — Anesthesia Procedure Notes (Signed)
Spinal  Patient location during procedure: OR Staffing Anesthesiologist: Lamaya Hyneman Performed: anesthesiologist  Preanesthetic Checklist Completed: patient identified, site marked, surgical consent, pre-op evaluation, timeout performed, IV checked, risks and benefits discussed and monitors and equipment checked Spinal Block Patient position: sitting Prep: ChloraPrep Patient monitoring: heart rate, cardiac monitor, continuous pulse ox and blood pressure Approach: midline Location: L4-5 Injection technique: single-shot Needle Needle type: Pencan  Needle gauge: 24 G Assessment Sensory level: T6 Additional Notes Functioning IV was confirmed and monitors were applied. Sterile prep and drape, including hand hygiene, mask and sterile gloves were used. The patient was positioned and the spine was prepped. The skin was anesthetized with lidocaine.  Free flow of clear CSF was obtained prior to injecting local anesthetic into the CSF.  The spinal needle aspirated freely following injection.  The needle was carefully withdrawn.  The patient tolerated the procedure well. Consent was obtained prior to procedure with all questions answered and concerns addressed.  Ben Dawnetta Copenhaver, MD     

## 2016-04-20 NOTE — Discharge Instructions (Signed)

## 2016-04-20 NOTE — Anesthesia Preprocedure Evaluation (Addendum)
Anesthesia Evaluation  Patient identified by MRN, date of birth, ID band Patient awake    Reviewed: Allergy & Precautions, NPO status , Patient's Chart, lab work & pertinent test results  Airway Mallampati: I  TM Distance: >3 FB Neck ROM: Full    Dental  (+) Dental Advisory Given   Pulmonary asthma ,    Pulmonary exam normal        Cardiovascular hypertension, Pt. on medications and Pt. on home beta blockers Normal cardiovascular exam     Neuro/Psych    GI/Hepatic hiatal hernia,   Endo/Other  diabetes, Type 2, Insulin Dependent, Oral Hypoglycemic AgentsMorbid obesity  Renal/GU      Musculoskeletal  (+) Arthritis ,   Abdominal   Peds  Hematology   Anesthesia Other Findings Denies blood thinning meds, labs acceptable, no cardiac signs or symptoms, wants spinal anesthesia, aware of risks and has no further questions or concerns  Reproductive/Obstetrics                           Anesthesia Physical Anesthesia Plan  ASA: III  Anesthesia Plan: Spinal   Post-op Pain Management:    Induction: Intravenous  Airway Management Planned: Simple Face Mask  Additional Equipment:   Intra-op Plan:   Post-operative Plan:   Informed Consent: I have reviewed the patients History and Physical, chart, labs and discussed the procedure including the risks, benefits and alternatives for the proposed anesthesia with the patient or authorized representative who has indicated his/her understanding and acceptance.     Plan Discussed with: CRNA and Surgeon  Anesthesia Plan Comments:         Anesthesia Quick Evaluation

## 2016-04-20 NOTE — Progress Notes (Signed)
Patient's blood sugar 68, patient states that she is asymptomatic.  Notified Dr. Michelle Piperssey and received verbal order to give 1/2 amp D50.  Will continue to monitor patient.

## 2016-04-20 NOTE — Anesthesia Procedure Notes (Signed)
Procedure Name: LMA Insertion Date/Time: 04/20/2016 2:43 PM Performed by: Coralee RudFLORES, Holger Sokolowski Pre-anesthesia Checklist: Patient identified, Emergency Drugs available, Suction available and Patient being monitored Patient Re-evaluated:Patient Re-evaluated prior to inductionOxygen Delivery Method: Circle system utilized Preoxygenation: Pre-oxygenation with 100% oxygen Intubation Type: IV induction Ventilation: Mask ventilation without difficulty LMA: LMA inserted LMA Size: 4.0 Number of attempts: 1 Placement Confirmation: positive ETCO2,  ETT inserted through vocal cords under direct vision and breath sounds checked- equal and bilateral Tube secured with: Tape Dental Injury: Teeth and Oropharynx as per pre-operative assessment

## 2016-04-20 NOTE — OR Nursing (Signed)
1615: in&out cath=300cc clear amber urine, per protocol

## 2016-04-20 NOTE — Progress Notes (Signed)
Admitted to 5N24 from PACU post knee surgery. Alert and oriented, not in any distress, VSS, Oriented to room and staff,will endorse appropriately,

## 2016-04-20 NOTE — Progress Notes (Signed)
Orthopedic Tech Progress Note Patient Details:  Jennifer [name] 1971-06-01 [id number] Put patient in CPM at 1645 CPM Left Knee CPM Left Knee: On Left Knee Flexion (Degrees): 40 Left Knee Extension (Degrees): 0 Additional Comments: Footsie Roll   Jennifer [name] 04/20/2016, 4:55 PM

## 2016-04-20 NOTE — Op Note (Signed)
PATIENT ID:      Jennifer [name]  MRN:     [id number] DOB/AGE:    1971-04-16 / 45 y.o.       OPERATIVE REPORT    DATE OF PROCEDURE:  04/20/2016       PREOPERATIVE DIAGNOSIS:   LEFT KNEE OSTEOARTHRITIS, 15deg FFC, 90 deg flexion     Estimated body mass index is 38.23 kg/m as calculated from the following:   Height as of this encounter: 5\' 2"  (1.575 m).   Weight as of this encounter: 94.8 kg (209 lb).                                                        POSTOPERATIVE DIAGNOSIS:   LEFT KNEE OSTEOARTHRITIS                                                                      PROCEDURE:  Procedure(s): TOTAL KNEE ARTHROPLASTY Using DepuyAttune RP implants #5NL Femur, #4Tibia, 5 mm Attune RP bearing, 35 Patella     SURGEON: Shaman Muscarella J    ASSISTANT:   Eric K. [employer] Energy[employer]ill[employer] PA-C   (Present and scrubbed throughout the case, critical for assistance with exposure, retraction, instrumentation, and closure.)         ANESTHESIA: Spinal, GLMA, 20cc Exparel, 50cc 0.25% Marcaine  EBL: 400  FLUID REPLACEMENT: 1500 crystalloid  TOURNIQUET TIME: 15min  Drains: None  Tranexamic Acid: 1gm iv, 3gm topical   COMPLICATIONS:  None         INDICATIONS FOR PROCEDURE: The patient has  LEFT KNEE OSTEOARTHRITIS, Var deformities, XR shows bone on bone arthritis, lateral subluxation of tibia. Patient has failed all conservative measures including anti-inflammatory medicines, narcotics, attempts at  exercise and weight loss, cortisone injections and viscosupplementation.  Risks and benefits of surgery have been discussed, questions answered.   DESCRIPTION OF PROCEDURE: The patient identified by armband, received  IV antibiotics[facility] Patient taken to the operating room, appropriate anesthetic  monitors were attached, and Spinal, GLMA anesthesia was  induced. Tourniquet  applied high to the operative thigh. Lateral post and foot positioner  applied to the table, the lower  extremity was then prepped and draped  in usual sterile fashion from the toes to the tourniquet. Time-out procedure was performed. We began the operation, with the knee flexed 120 degrees, by making the anterior midline incision starting at handbreadth above the patella going over the patella 1 cm medial to and 4 cm distal to the tibial tubercle. Small bleeders in the skin and the  subcutaneous tissue identified and cauterized. Transverse retinaculum was incised and reflected medially and a medial parapatellar arthrotomy was accomplished. the patella was everted and theprepatellar fat pad resected. The superficial medial collateral  ligament was then elevated from anterior to posterior along the proximal  flare of the tibia and anterior half of the menisci resected. The knee was hyperflexed exposing bone on bone arthritis. Peripheral and notch osteophytes as well as the cruciate ligaments were then resected. We continued to  work our way  around posteriorly along the proximal tibia, and externally  rotated the tibia subluxing it out from underneath the femur. A McHale  retractor was placed through the notch and a lateral Hohmann retractor  placed, and we then drilled through the proximal tibia in line with the  axis of the tibia followed by an intramedullary guide rod and 2-degree  posterior slope cutting guide. The tibial cutting guide, 3 degree posterior sloped, was pinned into place allowing resection of 7 mm of bone medially and 12 mm of bone laterally. Satisfied with the tibial resection, we then  entered the distal femur 2 mm anterior to the PCL origin with the  intramedullary guide rod and applied the distal femoral cutting guide  set at 9 mm, with 5 degrees of valgus. This was pinned along the  epicondylar axis. At this point, the distal femoral cut was accomplished without difficulty. We then sized for a #5 femoral component and pinned the guide in 3 degrees of external rotation. The chamfer  cutting guide was pinned into place. The anterior, posterior, and chamfer cuts were accomplished without difficulty followed by  the Attune RP box cutting guide and the box cut. We also removed posterior osteophytes from the posterior femoral condyles. At this  time, the knee was brought into full extension. We checked our  extension and flexion gaps and found them symmetric for a 5 mm bearing. Distracting in extension with a lamina spreader, the posterior horns of the menisci were removed, and Exparel, diluted to 60 cc, with 20cc NS, and 20cc 0.5% Marcaine,was injected into the capsule and synovium of the knee. The posterior patella cut was accomplished with the 9.5 mm Attune cutting guide, sized for a 35mm dome, and the fixation pegs drilled.The knee  was then once again hyperflexed exposing the proximal tibia. We sized for a # 4 tibial base plate, applied the smokestack and the conical reamer followed by the the Delta fin keel punch. We then hammered into place the Attune RP trial femoral component, drilled the lugs, inserted a  5 mm trial bearing, trial patellar button, and took the knee through range of motion from 0-130 degrees. No thumb pressure was required for patellar Tracking. At this point, the limb was wrapped with an Esmarch bandage and the tourniquet inflated to 350 mmHg. All trial components were removed, mating surfaces irrigated with pulse lavage, and dried with suction and sponges. A double batch of DePuy HV cement with 1500 mg of Zinacef was mixed and applied to all bony metallic mating surfaces except for the posterior condyles of the femur itself. In order, we  hammered into place the tibial tray and removed excess cement, the femoral component and removed excess cement. The final Attune RP bearing  was inserted, and the knee brought to full extension with compression.  The patellar button was clamped into place, and excess cement  removed. While the cement cured the wound was  irrigated out with normal saline solution pulse lavage. Ligament stability and patellar tracking were checked and found to be excellent. The parapatellar arthrotomy was closed with  running #1 Vicryl suture. The subcutaneous tissue with 0 and 2-0 undyed  Vicryl suture, and the skin with running 3-0 SQ vicryl. A dressing of Xeroform,  4 x 4, dressing sponges, Webril, and Ace wrap applied. The patient  awakened, and taken to recovery room without difficulty.   Herberth Deharo J 04/20/2016, 3:51 PM

## 2016-04-21 ENCOUNTER — Encounter (HOSPITAL_COMMUNITY): Payer: Self-pay | Admitting: *Deleted

## 2016-04-21 LAB — BASIC METABOLIC PANEL
Anion gap: 9 (ref 5–15)
BUN: 6 mg/dL (ref 6–20)
CHLORIDE: 100 mmol/L — AB (ref 101–111)
CO2: 25 mmol/L (ref 22–32)
CREATININE: 0.75 mg/dL (ref 0.44–1.00)
Calcium: 8.8 mg/dL — ABNORMAL LOW (ref 8.9–10.3)
GFR calc Af Amer: >60 mL/min (ref >60)
GFR calc non Af Amer: >60 mL/min (ref >60)
GLUCOSE: 123 mg/dL — AB (ref 65–99)
POTASSIUM: 3.5 mmol/L (ref 3.5–5.1)
SODIUM: 134 mmol/L — AB (ref 135–145)

## 2016-04-21 LAB — CBC
HEMATOCRIT: 34 % — AB (ref 36.0–46.0)
HEMOGLOBIN: 11.1 g/dL — AB (ref 12.0–15.0)
MCH: 28.4 pg (ref 26.0–34.0)
MCHC: 32.6 g/dL (ref 30.0–36.0)
MCV: 87 fL (ref 78.0–100.0)
Platelets: 275 10*3/uL (ref 150–400)
RBC: 3.91 MIL/uL (ref 3.87–5.11)
RDW: 13.3 % (ref 11.5–15.5)
WBC: 10.7 10*3/uL — ABNORMAL HIGH (ref 4.0–10.5)

## 2016-04-21 LAB — GLUCOSE, CAPILLARY
GLUCOSE-CAPILLARY: 155 mg/dL — AB (ref 65–99)
Glucose-Capillary: 106 mg/dL — ABNORMAL HIGH (ref 65–99)
Glucose-Capillary: 113 mg/dL — ABNORMAL HIGH (ref 65–99)
Glucose-Capillary: 133 mg/dL — ABNORMAL HIGH (ref 65–99)

## 2016-04-21 LAB — HEMOGLOBIN A1C
HEMOGLOBIN A1C: 5.7 % — AB (ref 4.8–5.6)
Mean Plasma Glucose: 117 mg/dL

## 2016-04-21 MED ORDER — IPRATROPIUM-ALBUTEROL 0.5-2.5 (3) MG/3ML IN SOLN
3.0000 mL | RESPIRATORY_TRACT | Status: DC | PRN
  Administered 2016-04-22: 3 mL via RESPIRATORY_TRACT

## 2016-04-21 MED ORDER — INFLUENZA VAC SPLIT QUAD 0.5 ML IM SUSY
0.5000 mL | PREFILLED_SYRINGE | INTRAMUSCULAR | Status: AC
  Administered 2016-04-22: 0.5 mL via INTRAMUSCULAR
  Filled 2016-04-21: qty 0.5

## 2016-04-21 MED ORDER — CELECOXIB 200 MG PO CAPS
200.0000 mg | ORAL_CAPSULE | Freq: Two times a day (BID) | ORAL | Status: DC
  Administered 2016-04-21 – 2016-04-22 (×3): 200 mg via ORAL
  Filled 2016-04-21 (×3): qty 1

## 2016-04-21 NOTE — Progress Notes (Signed)
Talked to Dr Wadie Lessenowan's PA regarding c/o pain and interventions not relieving it, new order obtained. Patient is crying hysterically due to pain, informed PA it feels like burning stinging pain. Will continue to assess.

## 2016-04-21 NOTE — Progress Notes (Signed)
Orthopedic Tech Progress Note Patient Details:  Jennifer [name] 1971-06-23 [id number] Put patient in CPM at 1900 CPM Left Knee CPM Left Knee: On Left Knee Flexion (Degrees): 40 Left Knee Extension (Degrees): 10 Additional Comments: Footsie Roll   Clois Dupesvery S Nilsa Macht 04/21/2016, 8:30 PM

## 2016-04-21 NOTE — Evaluation (Signed)
Occupational Therapy Evaluation Patient Details Name: Jennifer [name] MRN: [id number] DOB: 1970/09/19 Today's Date: 04/21/2016    History of Present Illness Patient is a 45 y/o female with PMH of HTN, HH, peripheral neuropathy, asthma and arthritis now s/p L TKA.   Clinical Impression   PTA pt independent in ADL and mobility. Pt currently mod A for ADL and min guard for mobility with RW. Pt significantly limited by pain. Pt had several episodes of crying and screaming that her leg was burning. Educated Pt in benefits of ice, and also light rubbing of the affected burning site for input for the nervous system. Pt will benefit from skilled OT intervention in the acute care setting prior to discharge.  Pt planning on going to SNF due to decreased independence in ADL (see deficit list below) and decreased caregiver support (Pt lives alone). Next session to focus on AE education and safe transfers for ADL.     Follow Up Recommendations  SNF;Supervision/Assistance - 24 hour    Equipment Recommendations  Other (comment) (TBD by next venue of care)    Recommendations for Other Services       Precautions / Restrictions Precautions Precautions: Fall;Knee Restrictions Weight Bearing Restrictions: Yes LLE Weight Bearing: Weight bearing as tolerated      Mobility Bed Mobility Overal bed mobility: Needs Assistance Bed Mobility: Supine to Sit     Supine to sit: Min guard;HOB elevated     General bed mobility comments: use of rails, increased time  Transfers Overall transfer level: Needs assistance Equipment used: Rolling walker (2 wheeled) Transfers: Sit to/from [employer];Stand Pivot Transfers Sit to Stand: Min guard Stand pivot transfers: Min assist       General transfer comment: cue for hand placement, technique    Balance Overall balance assessment: Needs assistance Sitting-balance support: No upper extremity supported;Feet supported Sitting balance-[name]: Good     Standing balance support: Bilateral upper extremity supported;During functional activity Standing balance-[name]: Poor Standing balance comment: verbal cues for posture during [facility] to recliner with RW                            ADL Overall ADL's : Needs assistance/impaired Eating/Feeding: Modified independent;Sitting   Grooming: Wash/dry hands;Wash/dry face;Set up;Sitting Grooming Details (indicate cue type and reason): sitting BSC Upper Body Bathing: Set up;Sitting Upper Body Bathing Details (indicate cue type and reason): sitting on the [facility] to relieve itching Lower Body Bathing: Set up;Sitting/lateral leans Lower Body Bathing Details (indicate cue type and reason): used wash clothes and propped right foot up on bed to clean     Lower Body Dressing: Moderate assistance;Sit to/from stand   Toilet Transfer: Min guard;Cueing for sequencing;Cueing for Engineer, agriculturalsafety;Stand-pivot;BSC;RW Toilet Transfer Details (indicate cue type and reason): max verbal encouragement for anxiety Toileting- Clothing Manipulation and Hygiene: Min guard       Functional mobility during ADLs: Min guard;Rolling walker General ADL Comments: Pt very anxious, but able to perform with encouragement perseverated on "You're going to tell my doctor how I am working hard right?" Pt with several episodes of crying during session due to fear and pain, but able to participate with therapy. Benefits from pointing out the positive and lots of compliments     Vision     Perception     Praxis      Pertinent Vitals/Pain Pain Assessment: 0-10 Pain Score: 10-Worst pain ever Faces Pain Scale: Hurts worst Pain Location: Left knee  Pain Descriptors / Indicators: Burning;Constant;Pins and needles;Sharp;Shooting;Sore;Tender Pain Intervention(s): Limited activity within patient's tolerance;Monitored during session;Repositioned;Ice applied (encouraged light rubbing of area during "burning")     Hand  Dominance Left   Extremity/Trunk Assessment Upper Extremity Assessment Upper Extremity Assessment: Overall WFL for tasks assessed   Lower Extremity Assessment Lower Extremity Assessment: LLE deficits/detail LLE Deficits / Details: post op decreased ROM and strength    Cervical / Trunk Assessment Cervical / Trunk Assessment: Normal   Communication Communication Communication: No difficulties   Cognition Arousal/Alertness: Awake/alert Behavior During Therapy: Anxious Overall Cognitive Status: Within Functional Limits for tasks assessed                     General Comments       Exercises       Shoulder Instructions      Home Living Family/patient expects to be discharged to:: Skilled nursing facility Living Arrangements: Alone   Type of Home: Apartment Home Access: Stairs to enter Entrance Stairs-Number of Steps: 1   Home Layout: One level               Home Equipment: None   Additional Comments: walk in shower, regular height toilet, apt is being rennovated      Prior Functioning/Environment Level of Independence: Independent        Comments: had a cane, left it in [place]        OT Problem List: Decreased strength;Decreased range of motion;Decreased activity tolerance;Impaired balance (sitting and/or standing);Decreased safety awareness;Decreased knowledge of use of DME or AE;Pain   OT Treatment/Interventions: Self-care/ADL training;DME and/or AE instruction;Therapeutic activities;Patient/family education;Balance training    OT Goals(Current goals can be found in the care plan section) Acute Rehab OT Goals Patient Stated Goal: To return to independent OT Goal Formulation: With patient Time For Goal Achievement: 04/28/16 Potential to Achieve Goals: Good ADL Goals Pt Will Perform Grooming: with modified independence;standing Pt Will Perform Lower Body Bathing: with modified independence;sit to/from stand;with adaptive equipment Pt Will Perform  Lower Body Dressing: with modified independence;sit to/from stand;with adaptive equipment Pt Will Transfer to Toilet: with modified independence;bedside commode;ambulating Pt Will Perform Toileting - Clothing Manipulation and hygiene: with modified independence;sit to/from stand Pt Will Perform Tub/Shower Transfer: Shower transfer;ambulating;with modified independence;rolling walker  OT Frequency: Min 2X/week   Barriers to D/C: Decreased caregiver support  lives alone, just moved here mid July from [place]       Co-evaluation              End of Session Equipment Utilized During Treatment: Gait belt;Rolling walker CPM Left Knee CPM Left Knee: Off Nurse Communication: Mobility status  Activity Tolerance: Patient limited by pain Patient left: in chair;with call bell/phone within reach   Time: 1451-1546 OT Time Calculation (min): 55 min Charges:  OT General Charges $OT Visit: 1 Procedure OT Evaluation $OT Eval Moderate Complexity: 1 Procedure OT Treatments $Self Care/Home Management : 38-52 mins G-Codes:    Jennifer [name] Jennifer [name] 05-13-16, 4:42 PM  Sherryl Manges OTR/L [phone]

## 2016-04-21 NOTE — Progress Notes (Signed)
Physical Therapy Treatment Patient Details Name: Jennifer [name] MRN: [id number] DOB: 09/04/1970 Today's Date: 04/21/2016    History of Present Illness Patient is a 45 y/o female with PMH of HTN, HH, peripheral neuropathy, asthma and arthritis now s/p L TKA.    PT Comments    Limited treatment due to pain.  Spent time educating patient on reason for pain with inflammatory response and how gentle passive movement and ice may help.. Placed her in CPM and ice applied.  RN also informed.  Will continue skilled PT in acute setting.   Follow Up Recommendations  SNF;Supervision/Assistance - 24 hour     Equipment Recommendations  Rolling walker with 5" wheels    Recommendations for Other Services       Precautions / Restrictions Precautions Precautions: Fall;Knee Restrictions Weight Bearing Restrictions: Yes LLE Weight Bearing: Weight bearing as tolerated    Mobility  Bed Mobility Overal bed mobility: Needs Assistance Bed Mobility: Supine to Sit     Supine to sit: Min guard;HOB elevated     General bed mobility comments: refused OOB and exercise due to pain  Transfers Overall transfer level: Needs assistance Equipment used: Rolling walker (2 wheeled) Transfers: Sit to/from [employer] Sit to Stand: Min guard Stand pivot transfers: Min assist       General transfer comment: cue for hand placement, technique  Ambulation/Gait                 Stairs            Wheelchair Mobility    Modified Rankin (Stroke Patients Only)       Balance Overall balance assessment: Needs assistance Sitting-balance support: No upper extremity supported;Feet supported Sitting balance-[name]: Good     Standing balance support: Bilateral upper extremity supported;During functional activity Standing balance-[name]: Poor Standing balance comment: verbal cues for posture during transition from [facility] to recliner with RW                     Cognition Arousal/Alertness: Awake/alert Behavior During Therapy: Anxious Overall Cognitive Status: Within Functional Limits for tasks assessed                      Exercises      General Comments General comments (skin integrity, edema, etc.): deferred mobility due to pain and RN informed.  Pt in footsie roll, but placed in CPM and ice applied to knee      Pertinent Vitals/Pain Pain Assessment: 0-10 Pain Score: 10-Worst pain ever Faces Pain Scale: Hurts worst Pain Location: L knee Pain Descriptors / Indicators: Burning Pain Intervention(s): Limited activity within patient's tolerance;Monitored during session;Repositioned;Ice applied    Home Living Family/patient expects to be discharged to:: Skilled nursing facility Living Arrangements: Alone   Type of Home: Apartment Home Access: Stairs to enter   Home Layout: One level Home Equipment: None Additional Comments: walk in shower, regular height toilet, apt is being rennovated    Prior Function Level of Independence: Independent      Comments: had a cane, left it in [place]   PT Goals (current goals can now be found in the care plan section) Acute Rehab PT Goals Patient Stated Goal: To return to independent Progress towards PT goals: Not progressing toward goals - comment (due to pain)    Frequency    7X/week      PT Plan Current plan remains appropriate    Co-evaluation  End of Session   Activity Tolerance: Patient limited by pain Patient left: in bed;with call bell/phone within reach     Time: 2440-10271402-1415 PT Time Calculation (min) (ACUTE ONLY): 13 min  Charges:  $Therapeutic Activity: 8-22 mins                    G Codes:      Jennifer McgregorCynthia [name] 04/21/2016, 6:01 PM  Jennifer [name], PT [phone] 04/21/2016

## 2016-04-21 NOTE — Evaluation (Signed)
Physical Therapy Evaluation Patient Details Name: Jennifer [name] MRN: [id number] DOB: [birth year] Today's Date: 04/21/2016   History of Present Illness  Patient is a 45 y/o female with PMH of HTN, HH, peripheral neuropathy, asthma and arthritis now s/p L TKA.  Clinical Impression  Patient presents with decreased independence with mobility due to deficits listed in PT problem list.  She will benefit from skilled PT in the acute setting to allow return home when independent after SNF level rehab.     Follow Up Recommendations SNF;Supervision/Assistance - 24 hour    Equipment Recommendations  Rolling walker with 5" wheels    Recommendations for Other Services       Precautions / Restrictions Precautions Precautions: Fall;Knee Restrictions LLE Weight Bearing: Weight bearing as tolerated      Mobility  Bed Mobility Overal bed mobility: Needs Assistance Bed Mobility: Supine to Sit     Supine to sit: Min guard;HOB elevated     General bed mobility comments: use of rails, increased time  Transfers Overall transfer level: Needs assistance Equipment used: Rolling walker (2 wheeled) Transfers: Sit to/from Stand Sit to Stand: Min assist         General transfer comment: cue for hand placement, technique  Ambulation/Gait Ambulation/Gait assistance: Min assist Ambulation Distance (Feet): 80 Feet Assistive device: Rolling walker (2 wheeled) Gait Pattern/deviations: Step-to pattern;Trunk flexed;Wide base of support;Decreased stride length;Antalgic     General Gait Details: limited tolerance to weight bearing, but pushing through.  Cues for posture during L stance, not leaning back during L swing  Stairs            Wheelchair Mobility    Modified Rankin (Stroke Patients Only)       Balance Overall balance assessment: Needs assistance   Sitting balance-[name]: Good     Standing balance support: Bilateral upper extremity supported Standing  balance-[name]: Poor Standing balance comment: demonstrates limited balance reactions with significant postural changes during gait due to pain L knee                             Pertinent Vitals/Pain Pain Assessment: Faces Faces Pain Scale: Hurts whole lot Pain Location: L knee Pain Descriptors / Indicators: Aching;Constant;Sore;Sharp Pain Intervention(s): Monitored during session;Repositioned;Ice applied    Home Living Family/patient expects to be discharged to:: Skilled nursing facility Living Arrangements: Alone   Type of Home: House Home Access: Stairs to enter   Secretary/administrator of Steps: 1 Home Layout: One level Home Equipment: None      Prior Function Level of Independence: Independent         Comments: had a cane, left it in [place]     Hand Dominance        Extremity/Trunk Assessment   Upper Extremity Assessment: Overall WFL for tasks assessed           Lower Extremity Assessment: LLE deficits/detail   LLE Deficits / Details: AAROM L knee flexion about 40 degrees, extension about 10; limited quad activation, but able to perform quad set     Communication   Communication: No difficulties  Cognition Arousal/Alertness: Awake/alert Behavior During Therapy: Anxious Overall Cognitive Status: Within Functional Limits for tasks assessed                      General Comments      Exercises Total Joint Exercises Ankle Circles/Pumps: AROM;Both;10 reps;Supine Quad Sets: AROM;Left;10 reps;Supine Short Arc Quad: AAROM;Left;10  reps;Supine Heel Slides: AAROM;Left;10 reps;Supine   Assessment/Plan    PT Assessment Patient needs continued PT services  PT Problem List Decreased strength;Decreased activity tolerance;Decreased balance;Pain;Decreased knowledge of use of DME;Decreased knowledge of precautions;Decreased mobility;Decreased safety awareness;Decreased range of motion          PT Treatment Interventions DME  instruction;Gait training;Functional mobility training;Balance training;Therapeutic exercise;Therapeutic activities;Patient/family education    PT Goals (Current goals can be found in the Care Plan section)  Acute Rehab PT Goals Patient Stated Goal: To return to independent PT Goal Formulation: With patient Time For Goal Achievement: 05/05/16 Potential to Achieve Goals: Good    Frequency 7X/week   Barriers to discharge Decreased caregiver support      Co-evaluation               End of Session Equipment Utilized During Treatment: Gait belt Activity Tolerance: Patient limited by pain Patient left: in chair;with call bell/phone within reach           Time: 0910-0941 PT Time Calculation (min) (ACUTE ONLY): 31 min   Charges:   PT Evaluation $PT Eval Moderate Complexity: 1 Procedure PT Treatments $Gait Training: 8-22 mins   PT G CodesElray [name]:        Jennifer [name] 04/21/2016, 10:44 AM  Jennifer [name], PT [phone] 04/21/2016

## 2016-04-21 NOTE — Progress Notes (Signed)
PATIENT ID: Jennifer [name]  MRN: [id number]  DOB/AGE:  46/23/1972 / 46 y.o.  1 Day Post-Op Procedure(s) (LRB): TOTAL KNEE ARTHROPLASTY (Left)    PROGRESS NOTE Subjective: Patient is alert, oriented, x1 Nausea, no Vomiting, yes passing gas. Taking PO well. Denies SOB, Chest or Calf Pain. Using Incentive Spirometer, PAS in place. Ambulate to bathroom, CPM 0-40 Patient reports pain as 4/10 .    Objective: Vital signs in last 24 hours: Vitals:   04/20/16 1823 04/20/16 2049 04/20/16 2102 04/21/16 0505  BP: 125/67 129/62  123/62  Pulse: 64 62  75  Resp:  14  16  Temp: 97.7 F (36.5 C) 98.4 F (36.9 C)  98.2 F (36.8 C)  TempSrc: Oral Oral  Oral  SpO2: 94% 97% 99% 99%  Weight:      Height:          Intake/Output from previous day: I/O last 3 completed shifts: In: 2164.6 [I.V.:2164.6] Out: 700 [Urine:550; Blood:150]   Intake/Output this shift: No intake/output data recorded.   LABORATORY DATA:  Recent Labs  04/20/16 1631 04/20/16 2211 04/21/16 0634  GLUCAP 105* 147* 106*    Examination: Neurologically intact ABD soft Neurovascular intact Sensation intact distally Intact pulses distally Dorsiflexion/Plantar flexion intact Incision: dressing C/D/I No cellulitis present Compartment soft}  Assessment:   1 Day Post-Op Procedure(s) (LRB): TOTAL KNEE ARTHROPLASTY (Left) ADDITIONAL DIAGNOSIS: Expected Acute Blood Loss Anemia, Diabetes and Hypertension  Plan: PT/OT WBAT, CPM 5/hrs day until ROM 0-90 degrees, then D/C CPM DVT Prophylaxis:  SCDx72hrs, ASA 325 mg BID x 2 weeks DISCHARGE PLAN: Skilled Nursing Facility/Rehab,Patient lives by herself, no friends or family in [place], as she just recently moved here DISCHARGE NEEDS: HHPT, CPM, Walker and 3-in-1 comode seat     Brystol Wasilewski J 04/21/2016, 7:44 AM

## 2016-04-22 LAB — CBC
HCT: 31.5 % — ABNORMAL LOW (ref 36.0–46.0)
Hemoglobin: 10.5 g/dL — ABNORMAL LOW (ref 12.0–15.0)
MCH: 28.8 pg (ref 26.0–34.0)
MCHC: 33.3 g/dL (ref 30.0–36.0)
MCV: 86.3 fL (ref 78.0–100.0)
Platelets: 257 10*3/uL (ref 150–400)
RBC: 3.65 MIL/uL — ABNORMAL LOW (ref 3.87–5.11)
RDW: 13.1 % (ref 11.5–15.5)
WBC: 10.4 10*3/uL (ref 4.0–10.5)

## 2016-04-22 LAB — GLUCOSE, CAPILLARY
GLUCOSE-CAPILLARY: 96 mg/dL (ref 65–99)
GLUCOSE-CAPILLARY: 135 mg/dL — AB (ref 65–99)
Glucose-Capillary: 126 mg/dL — ABNORMAL HIGH (ref 65–99)

## 2016-04-22 LAB — BASIC METABOLIC PANEL: GLUCOSE: 222 mg/dL

## 2016-04-22 NOTE — Progress Notes (Signed)
Orthopedic Tech Progress Note Patient Details:  Jennifer [name] 1970/07/29 [id number]  Patient ID: Jennifer [name], female   DOB: 1970/07/29, 45 y.o.   MRN: [id number]   Nikki DomCrawford, Trek Kimball 04/22/2016, 1:29 PM PLACED PT'S LLE ON CPM @ 0-40 d; RN notified

## 2016-04-22 NOTE — Clinical Social Work Note (Signed)
Clinical Social Work Assessment  Patient Details  Name: Jennifer [name] MRN: [id number] Date of Birth: [birth year]  Date of referral:  04/22/16               Reason for consult:  Facility Placement                Permission sought to share information with:   (fACILITY) Permission granted to share information::   Acupuncturist)  Name::        Agency::     Relationship::     Contact Information:     Housing/Transportation Living arrangements for the past 2 months:  Apartment Medical laboratory scientific officer) Source of Information:  Patient Patient Interpreter Needed:  None Criminal Activity/Legal Involvement Pertinent to Current Situation/Hospitalization:  No - Comment as needed Significant Relationships:  Other(Comment) (Patient states that she does not have a good support system. Patient states that her family is in [place]. ) Lives with:  Self Do you feel safe going back to the place where you live?  No (Patient states that her apartment is conducting rennovations and that she cannot go back.) Need for family participation in patient care:   (Patient states that she does not have good support system.)  Care giving concerns:  Patient states that due to have a Total knee replacement she needs assistance with ADLs. Patient states that she does not have a good support system.   Social Worker assessment / plan:  SW met with pt at bedside. Patient was alert and oriented. There was no family present. Patient states that she had a total knee replacement and due to that she feels as though she will need assistance. Patient states that she does not have anywhere to go due to her apartment being under renovations and having no family or friends. Patient states she receives $735 a month in SSI check. Patient states that she is interested in facility. SW made Assistant CSW Director aware of case.  SW reached out to [facility] who states that she will be able to review pt to see if she  meets criteria. SW will continue to follow up.   Tilda Burrow, MSW [phone]  Employment status:  Other (Comment) (Patient states she receives SSI check.) Insurance information:  Medicaid In [place] PT Recommendations:  [place] / Referral to community resources:   (SNF)  Patient/Family's Response to care:  accepting  Patient/Family's Understanding of and Emotional Response to Diagnosis, Current Treatment, and Prognosis:  No questions.   Emotional Assessment Appearance:  Appears stated age Attitude/Demeanor/Rapport:   (Appropriate.) Affect (typically observed):  Accepting Orientation:  Oriented to Self, Oriented to Place, Oriented to  Time, Oriented to Situation Alcohol / Substance use:  Not Applicable Psych involvement (Current and /or in the community):  No (Comment)  Discharge Needs  Concerns to be addressed:   (Patient does not have Medicare. Patient has Medicaid.) Readmission within the last 30 days:  No Current discharge risk:  None Barriers to Discharge:  No Barriers Identified   Tilda Burrow R 04/22/2016, 11:19 AM

## 2016-04-22 NOTE — Discharge Summary (Signed)
Patient ID: Jennifer [name] MRN: [id number] DOB/AGE: July 29, 1970 45 y.o.  Admit date: 04/20/2016 Discharge date: 04/22/2016  Admission Diagnoses:  Principal Problem:   Primary osteoarthritis of left knee   Discharge Diagnoses:  Same  Past Medical History:  Diagnosis Date  . Allergy    Singulair and Allgra daily  . Arthritis   . Asthma    has inhalers and neb treatments  . Chronic bronchitis (HCC)   . Diabetes (HCC)    states she doesn't check at home.Humalog 75/25 and Basaglar daily as well as Januvia  . History of hiatal hernia   . Hyperlipidemia    takes Lipitor daily  . Hypertension    takes Losartan and Metoprolol daily  . Joint pain   . Joint swelling   . Numbness    both hands  . Peripheral edema    takes Furosemide daily  . Peripheral neuropathy (HCC)    takes Lyrica daily    Surgeries: Procedure(s): TOTAL KNEE ARTHROPLASTY on 04/20/2016   Consultants:   Discharged Condition: Improved  Hospital Course: Jennifer [name] is an 45 y.o. female who was admitted 04/20/2016 for operative treatment ofPrimary osteoarthritis of left knee. Patient has severe unremitting pain that affects sleep, daily activities, and work/hobbies. After pre-op clearance the patient was taken to the operating room on 04/20/2016 and underwent  Procedure(s): TOTAL KNEE ARTHROPLASTY.    Patient was given perioperative antibiotics: Anti-infectives    Start     Dose/Rate Route Frequency Ordered Stop   04/21/16 0600  ceFAZolin (ANCEF) IVPB 2g/100 mL premix     2 g 200 mL/hr over 30 Minutes Intravenous On call to O.R. 04/20/16 1205 04/20/16 1430   04/20/16 1512  cefUROXime (ZINACEF) injection  Status:  Discontinued       As needed 04/20/16 1515 04/20/16 1622       Patient was given sequential compression devices, early ambulation, and chemoprophylaxis to prevent DVT.  Patient benefited maximally from hospital stay and there were no complications.    Recent vital signs: Patient  Vitals for the past 24 hrs:  BP Temp Temp src Pulse Resp SpO2  04/22/16 0446 109/62 98.5 F (36.9 C) Oral 74 18 100 %  04/21/16 2055 - - - - - 98 %  04/21/16 2022 118/68 98.7 F (37.1 C) Oral 73 16 100 %  04/21/16 1520 123/73 98.7 F (37.1 C) Oral 89 17 98 %     Recent laboratory studies:  Recent Labs  04/21/16 0725  WBC 10.7*  HGB 11.1*  HCT 34.0*  PLT 275  NA 134*  K 3.5  CL 100*  CO2 25  BUN 6  CREATININE 0.75  GLUCOSE 123*  CALCIUM 8.8*     Discharge Medications:     Medication List    TAKE these medications   ASMANEX HFA 200 MCG/ACT Aero Generic drug:  Mometasone Furoate Inhale 2 puffs into the lungs daily.   aspirin EC 325 MG tablet Take 1 tablet (325 mg total) by mouth 2 (two) times daily. What changed:  medication strength  how much to take  when to take this   atorvastatin 20 MG tablet Commonly known as:  LIPITOR Take 20 mg by mouth at bedtime.   BASAGLAR KWIKPEN 100 UNIT/ML Sopn Inject 25 Units into the skin 2 (two) times daily.   fexofenadine 180 MG tablet Commonly known as:  ALLEGRA Take 180 mg by mouth at bedtime.   FIBER PO Take 1 capsule by mouth 2 (two) times daily.  furosemide 20 MG tablet Commonly known as:  LASIX Take 20 mg by mouth every morning.   ibuprofen 600 MG tablet Commonly known as:  ADVIL,MOTRIN Take 1 tablet (600 mg total) by mouth every 6 (six) hours as needed.   insulin lispro protamine-lispro (75-25) 100 UNIT/ML Susp injection Commonly known as:  HUMALOG 75/25 MIX Inject 15 Units into the skin 3 (three) times daily.   ipratropium-albuterol 0.5-2.5 (3) MG/3ML Soln Commonly known as:  DUONEB Inhale 0.5 mLs into the lungs 2 (two) times daily.   losartan 100 MG tablet Commonly known as:  COZAAR Take 50 mg by mouth daily.   LYRICA 150 MG capsule Generic drug:  pregabalin Take 150 mg by mouth 2 (two) times daily.   methocarbamol 500 MG tablet Commonly known as:  ROBAXIN Take 1 tablet (500 mg total) by  mouth every 6 (six) hours as needed for muscle spasms. What changed:  when to take this  reasons to take this   metoprolol 50 MG tablet Commonly known as:  LOPRESSOR Take 50 mg by mouth daily.   montelukast 10 MG tablet Commonly known as:  SINGULAIR Take 10 mg by mouth at bedtime.   oxyCODONE-acetaminophen 5-325 MG tablet Commonly known as:  ROXICET Take 1 tablet by mouth every 4 (four) hours as needed.   QVAR 80 MCG/ACT inhaler Generic drug:  beclomethasone Inhale 1-2 puffs into the lungs 2 (two) times daily.   sitaGLIPtin 100 MG tablet Commonly known as:  JANUVIA Take 100 mg by mouth daily.   Vitamin D 2000 units Caps Take 2,000 Units by mouth daily.       Diagnostic Studies: Dg Chest 2 View  Result Date: 04/10/2016 CLINICAL DATA:  Preop left knee replacement. Chronic bronchitis/ asthma. EXAM: CHEST  2 VIEW COMPARISON:  None. FINDINGS: The heart size and mediastinal contours are within normal limits. Both lungs are clear. The visualized skeletal structures are unremarkable. IMPRESSION: No active cardiopulmonary disease. Electronically Signed   By: Charlett Nose M.D.   On: 04/10/2016 11:24    Disposition: 01-Home or Self Care  Discharge Instructions    CPM    Complete by:  As directed    Continuous passive motion machine (CPM):      Use the CPM from 0 to 60  for 5 hours per day.      You may increase by 10 degrees per day.  You may break it up into 2 or 3 sessions per day.      Use CPM for 2 weeks or until you are told to stop.   Call MD / Call 911    Complete by:  As directed    If you experience chest pain or shortness of breath, CALL 911 and be transported to the hospital emergency room.  If you develope a fever above 101 F, pus (white drainage) or increased drainage or redness at the wound, or calf pain, call your surgeon's office.   Constipation Prevention    Complete by:  As directed    Drink plenty of fluids.  Prune juice may be helpful.  You may use a  stool softener, such as Colace (over the counter) 100 mg twice a day.  Use MiraLax (over the counter) for constipation as needed.   Diet - low sodium heart healthy    Complete by:  As directed    Discharge wound care:    Complete by:  As directed    If you have a hip bandage, keep it clean  and dry.  Change your bandage as instructed by your health care providers.  If your bandage has been discontinued, keep your incision clean and dry.  Pat dry after bathing.  DO NOT put lotion or powder on your incision.   Driving restrictions    Complete by:  As directed    No driving for 2 weeks   Increase activity slowly as tolerated    Complete by:  As directed    Patient may shower    Complete by:  As directed    You may shower without a dressing once there is no drainage.  Do not wash over the wound.  If drainage remains, cover wound with plastic wrap and then shower.      Follow-up Information    Nestor LewandowskyOWAN,FRANK J, MD Follow up in 2 week(s).   Specialty:  Orthopedic Surgery Contact information: [address] [place] [address] [phone]            Signed: Vear ClockHILLIPS, Ashley Bultema R 04/22/2016, 8:15 AM

## 2016-04-22 NOTE — Progress Notes (Signed)
[facility] per MD order. Prior to d/c, rept called to Facilities managerKaye RN at [facility]. No change in pt's AM assessment upon discharge. Pt pain controlled upon discharge.

## 2016-04-22 NOTE — Progress Notes (Signed)
PATIENT ID: Jennifer [name]  MRN: [id number]  DOB/AGE:  46-29-1972 / 45 y.o.  2 Days Post-Op Procedure(s) (LRB): TOTAL KNEE ARTHROPLASTY (Left)    PROGRESS NOTE Subjective: Patient is alert, oriented, no Nausea, no Vomiting, yes passing gas. Taking PO well. Denies SOB, Chest or Calf Pain. Using Incentive Spirometer, PAS in place. Ambulate WBAT with pt walking 80 ft with therapy, CPM 10-40 Patient reports pain as 5-6/10 .    Objective: Vital signs in last 24 hours: Vitals:   04/21/16 1520 04/21/16 2022 04/21/16 2055 04/22/16 0446  BP: 123/73 118/68  109/62  Pulse: 89 73  74  Resp: 17 16  18   Temp: 98.7 F (37.1 C) 98.7 F (37.1 C)  98.5 F (36.9 C)  TempSrc: Oral Oral  Oral  SpO2: 98% 100% 98% 100%  Weight:      Height:          Intake/Output from previous day: I/O last 3 completed shifts: In: 4659.6 [P.O.:1120; I.V.:3539.6] Out: 250 [Urine:250]   Intake/Output this shift: No intake/output data recorded.   LABORATORY DATA:  Recent Labs  04/21/16 0725  04/21/16 1655 04/21/16 2141 04/22/16 0641  WBC 10.7*  --   --   --   --   HGB 11.1*  --   --   --   --   HCT 34.0*  --   --   --   --   PLT 275  --   --   --   --   NA 134*  --   --   --   --   K 3.5  --   --   --   --   CL 100*  --   --   --   --   CO2 25  --   --   --   --   BUN 6  --   --   --   --   CREATININE 0.75  --   --   --   --   GLUCOSE 123*  --   --   --   --   GLUCAP  --   < > 113* 133* 96  CALCIUM 8.8*  --   --   --   --   < > = values in this interval not displayed.  Examination: Neurologically intact Neurovascular intact Sensation intact distally Intact pulses distally Dorsiflexion/Plantar flexion intact Incision: dressing C/D/I No cellulitis present Compartment soft}  Assessment:   2 Days Post-Op Procedure(s) (LRB): TOTAL KNEE ARTHROPLASTY (Left) ADDITIONAL DIAGNOSIS: Expected Acute Blood Loss Anemia, Diabetes and Hypertension  Plan: PT/OT WBAT, CPM 5/hrs day until ROM 0-90  degrees, then D/C CPM DVT Prophylaxis:  SCDx72hrs, ASA 325 mg BID x 2 weeks DISCHARGE PLAN: Skilled Nursing Facility/Rehab DISCHARGE NEEDS: HHPT, CPM, Walker and 3-in-1 comode seat     Almeda Ezra R 04/22/2016, 8:13 AM

## 2016-04-22 NOTE — Clinical Social Work Placement (Signed)
   CLINICAL SOCIAL WORK PLACEMENT  NOTE  Date:  04/22/2016  Patient Details  Name: Jennifer [name] MRN: [id number] Date of Birth: Nov 24, 1970  Clinical Social Work is seeking post-discharge placement for this patient at the Skilled  Nursing Facility level of care (*CSW will initial, date and re-position this form in  chart as items are completed):  Yes   Patient/family provided with [facility] Clinical Social Work Department's list of facilities offering this level of care within the geographic area requested by the patient (or if unable, by the patient's family).  Yes   Patient/family informed of their freedom to choose among providers that offer the needed level of care, that participate in Medicare, Medicaid or managed care program needed by the patient, have an available bed and are willing to accept the patient.  Yes   Patient/family informed of [facility]'s ownership interest in [facility] and [facility], as well as of the fact that they are under no obligation to receive care at these facilities.  PASRR submitted to EDS on       PASRR number received on       Existing PASRR number confirmed on       FL2 transmitted to all facilities in geographic area requested by pt/family on       FL2 transmitted to all facilities within larger geographic area on       Patient informed that his/her managed care company has contracts with or will negotiate with certain facilities, including the following:        Yes   Patient/family informed of bed offers received.  Patient chooses bed at  [facility]     Physician recommends and patient chooses bed at      [facility] to be [facility] on 04/22/16.  Patient to be transferred to facility by  [facility]     Patient family notified on 04/22/16 of transfer.  Name of family member notified:   (Patient states she has no one for SW to notify)     PHYSICIAN       Additional Comment:     _______________________________________________ Alene MiresWhitaker, Haroon Shatto R 04/22/2016, 2:27 PM

## 2016-04-22 NOTE — Progress Notes (Signed)
Physical Therapy Treatment Patient Details Name: Jennifer [name] MRN: [id number] DOB: [birth year] Today's Date: 04/22/2016    History of Present Illness Patient is a 45 y/o female with PMH of HTN, HH, peripheral neuropathy, asthma and arthritis now s/p L TKA.    PT Comments    Patient is progressing well toward mobility goals. Continue to progress as tolerated with anticipated d/c to SNF for further skilled PT services.    Follow Up Recommendations  SNF;Supervision/Assistance - 24 hour     Equipment Recommendations  Rolling walker with 5" wheels    Recommendations for Other Services       Precautions / Restrictions Precautions Precautions: Fall;Knee Precaution Comments: reviewed precuations and positioning Restrictions Weight Bearing Restrictions: Yes LLE Weight Bearing: Weight bearing as tolerated    Mobility  Bed Mobility Overal bed mobility: Needs Assistance Bed Mobility: Supine to Sit     Supine to sit: Supervision     General bed mobility comments: supervision for safety; HOB elevated and min use of rail   Transfers Overall transfer level: Needs assistance Equipment used: Rolling walker (2 wheeled) Transfers: Sit to/from Stand Sit to Stand: Supervision         General transfer comment: cues for safe hand placement with carry over demonstrated during session; from EOB, recliner, and BSC  Ambulation/Gait Ambulation/Gait assistance: Supervision Ambulation Distance (Feet): 100 Feet Assistive device: Rolling walker (2 wheeled) Gait Pattern/deviations: Step-through pattern;Decreased stance time - left;Decreased stride length;Decreased weight shift to left Gait velocity: decreased   General Gait Details: cues for increased L knee flexion and posture; pt with improved ability to WB on L LE this session   Stairs            Wheelchair Mobility    Modified Rankin (Stroke Patients Only)       Balance     Sitting balance-[name]: Good        Standing balance-[name]: Fair Standing balance comment: static standing no UE support                    Cognition Arousal/Alertness: Awake/alert Behavior During Therapy: WFL for tasks assessed/performed Overall Cognitive Status: Within Functional Limits for tasks assessed                      Exercises Total Joint Exercises Quad Sets: AROM;Both;10 reps;Supine Heel Slides: AROM;Left;10 reps;Supine Straight Leg Raises: AROM;Left;10 reps;Supine Knee Flexion: AROM;Left;10 reps;Seated Goniometric ROM: 5-90    General Comments        Pertinent Vitals/Pain Pain Assessment: 0-10 Pain Score: 5  Pain Location: L knee Pain Descriptors / Indicators: Burning;Sore Pain Intervention(s): Limited activity within patient's tolerance;Monitored during session;Premedicated before session;Repositioned    Home Living                      Prior Function            PT Goals (current goals can now be found in the care plan section) Acute Rehab PT Goals Patient Stated Goal: To return to independent Progress towards PT goals: Progressing toward goals    Frequency    7X/week      PT Plan Current plan remains appropriate    Co-evaluation             End of Session Equipment Utilized During Treatment: Gait belt Activity Tolerance: Patient tolerated treatment well Patient left: in chair;with call bell/phone within reach     Time: 4098-1191 PT Time Calculation (  min) (ACUTE ONLY): 46 min  Charges:  $Gait Training: 8-22 mins $Therapeutic Exercise: 8-22 mins $Therapeutic Activity: 8-22 mins                    G Codes:      Jennifer MoundKellyn R Dagmawi Venable Daja [name], PTA Pager: [phone]   04/22/2016, 9:44 AM

## 2016-04-22 NOTE — Progress Notes (Signed)
Transportation was arranged with [facility] for a 4:00pm pick up. SW called again to confirm and they state pt is still on the list to come and get to take to [facility]/ [phone].

## 2016-04-22 NOTE — NC FL2 (Signed)
Why MEDICAID FL2 LEVEL OF CARE SCREENING TOOL     IDENTIFICATION  Patient Name: Jennifer [name] Birthdate: 1971/05/15 Sex: female Admission Date (Current Location): 04/20/2016  [facility] and [place] Number:  Producer, television/film/video and Address:  [facility] [place], [address] [place], [place] [address]      Provider Number: [id number]  Attending Physician Name and Address:  Gean Birchwood, MD  Relative Name and Phone Number:       Current Level of Care: Hospital Recommended Level of Care: Skilled Nursing Facility Prior Approval Number:    Date Approved/Denied:   PASRR Number:  [id number])  Discharge Plan: SNF    Current Diagnoses: Patient Active Problem List   Diagnosis Date Noted  . Primary osteoarthritis of left knee 04/19/2016    Orientation RESPIRATION BLADDER Height & Weight     Situation, Time, Place, Self  Normal Continent Weight: 209 lb (94.8 kg) Height:  5\' 2"  (157.5 cm)  BEHAVIORAL SYMPTOMS/MOOD NEUROLOGICAL BOWEL NUTRITION STATUS      Continent    AMBULATORY STATUS COMMUNICATION OF NEEDS Skin   Limited Assist Verbally Normal                       Personal Care Assistance Level of Assistance  Bathing, Dressing Bathing Assistance: Limited assistance   Dressing Assistance: Limited assistance     Functional Limitations Info             SPECIAL CARE FACTORS FREQUENCY                       Contractures      Additional Factors Info   (Full)               Current Medications (04/22/2016):  This is the current hospital active medication list Current Facility-Administered Medications  Medication Dose Route Frequency Provider Last Rate Last Dose  . acetaminophen (TYLENOL) tablet 650 mg  650 mg Oral Q6H PRN Gean Birchwood, MD   650 mg at 04/22/16 0431   Or  . acetaminophen (TYLENOL) suppository 650 mg  650 mg Rectal Q6H PRN Gean Birchwood, MD      . alum & mag hydroxide-simeth (MAALOX/MYLANTA) 200-200-20  MG/5ML suspension 30 mL  30 mL Oral Q4H PRN Gean Birchwood, MD      . aspirin EC tablet 325 mg  325 mg Oral Q breakfast Gean Birchwood, MD   325 mg at 04/22/16 0736  . atorvastatin (LIPITOR) tablet 20 mg  20 mg Oral QHS Gean Birchwood, MD   20 mg at 04/21/16 2200  . bisacodyl (DULCOLAX) EC tablet 5 mg  5 mg Oral Daily PRN Gean Birchwood, MD      . budesonide (PULMICORT) nebulizer solution 0.25 mg  0.25 mg Nebulization BID Gean Birchwood, MD   0.25 mg at 04/22/16 0935  . celecoxib (CELEBREX) capsule 200 mg  200 mg Oral BID Allena Katz, PA-C   200 mg at 04/22/16 1012  . cholecalciferol (VITAMIN D) tablet 2,000 Units  2,000 Units Oral Daily Gean Birchwood, MD   2,000 Units at 04/22/16 1011  . dextrose 5 % and 0.45 % NaCl with KCl 20 mEq/L infusion   Intravenous Continuous Gean Birchwood, MD 125 mL/hr at 04/21/16 2300    . diphenhydrAMINE (BENADRYL) 12.5 MG/5ML elixir 12.5-25 mg  12.5-25 mg Oral Q4H PRN Gean Birchwood, MD   25 mg at 04/21/16 2005  . docusate sodium (COLACE) capsule 100 mg  100 mg Oral BID Gean BirchwoodFrank Rowan, MD   100 mg at 04/22/16 1011  . HYDROmorphone (DILAUDID) injection 0.5-1 mg  0.5-1 mg Intravenous Q2H PRN Gean BirchwoodFrank Rowan, MD   1 mg at 04/21/16 1246  . Influenza vac split quadrivalent PF (FLUARIX) injection 0.5 mL  0.5 mL Intramuscular Tomorrow-1000 Gean BirchwoodFrank Rowan, MD      . insulin aspart (novoLOG) injection 0-15 Units  0-15 Units Subcutaneous TID WC Gean BirchwoodFrank Rowan, MD   3 Units at 04/21/16 1243  . insulin glargine (LANTUS) injection 25 Units  25 Units Subcutaneous BID Gean BirchwoodFrank Rowan, MD   25 Units at 04/21/16 2146  . ipratropium-albuterol (DUONEB) 0.5-2.5 (3) MG/3ML nebulizer solution 0.5 mL  0.5 mL Inhalation BID Gean BirchwoodFrank Rowan, MD   0.5 mL at 04/21/16 2055  . ipratropium-albuterol (DUONEB) 0.5-2.5 (3) MG/3ML nebulizer solution 3 mL  3 mL Nebulization Q4H PRN Gean BirchwoodFrank Rowan, MD   3 mL at 04/22/16 0935  . linagliptin (TRADJENTA) tablet 5 mg  5 mg Oral Daily Gean BirchwoodFrank Rowan, MD   5 mg at 04/22/16 1011  . loratadine (CLARITIN)  tablet 10 mg  10 mg Oral Daily Gean BirchwoodFrank Rowan, MD   10 mg at 04/22/16 1011  . losartan (COZAAR) tablet 50 mg  50 mg Oral Daily Gean BirchwoodFrank Rowan, MD   50 mg at 04/22/16 1013  . menthol-cetylpyridinium (CEPACOL) lozenge 3 mg  1 lozenge Oral PRN Gean BirchwoodFrank Rowan, MD       Or  . phenol (CHLORASEPTIC) mouth spray 1 spray  1 spray Mouth/Throat PRN Gean BirchwoodFrank Rowan, MD      . methocarbamol (ROBAXIN) tablet 500 mg  500 mg Oral Q6H PRN Gean BirchwoodFrank Rowan, MD   500 mg at 04/21/16 0036   Or  . methocarbamol (ROBAXIN) 500 mg in dextrose 5 % 50 mL IVPB  500 mg Intravenous Q6H PRN Gean BirchwoodFrank Rowan, MD      . metoCLOPramide (REGLAN) tablet 5-10 mg  5-10 mg Oral Q8H PRN Gean BirchwoodFrank Rowan, MD       Or  . metoCLOPramide (REGLAN) injection 5-10 mg  5-10 mg Intravenous Q8H PRN Gean BirchwoodFrank Rowan, MD      . metoprolol (LOPRESSOR) tablet 50 mg  50 mg Oral Daily Gean BirchwoodFrank Rowan, MD   50 mg at 04/22/16 1012  . montelukast (SINGULAIR) tablet 10 mg  10 mg Oral QHS Gean BirchwoodFrank Rowan, MD   10 mg at 04/21/16 2145  . ondansetron (ZOFRAN) tablet 4 mg  4 mg Oral Q6H PRN Gean BirchwoodFrank Rowan, MD       Or  . ondansetron [facility](ZOFRAN) injection 4 mg  4 mg Intravenous Q6H PRN Gean BirchwoodFrank Rowan, MD      . oxyCODONE (Oxy IR/ROXICODONE) immediate release tablet 5-10 mg  5-10 mg Oral Q3H PRN Gean BirchwoodFrank Rowan, MD   10 mg at 04/22/16 1037  . pregabalin (LYRICA) capsule 150 mg  150 mg Oral BID Gean BirchwoodFrank Rowan, MD   150 mg at 04/22/16 1012  . senna-docusate (Senokot-S) tablet 1 tablet  1 tablet Oral QHS PRN Gean BirchwoodFrank Rowan, MD      . sodium phosphate (FLEET) 7-19 GM/118ML enema 1 enema  1 enema Rectal Once PRN Gean BirchwoodFrank Rowan, MD         Discharge Medications: Please see discharge summary for a list of discharge medications.  Relevant Imaging Results:  Relevant Lab Results:   Additional Information  (SS : [id number])  Alene MiresWhitaker, Corrin Hingle R

## 2016-04-23 ENCOUNTER — Encounter: Payer: Self-pay | Admitting: Adult Health

## 2016-04-23 ENCOUNTER — Other Ambulatory Visit: Payer: Self-pay

## 2016-04-23 ENCOUNTER — Non-Acute Institutional Stay (SKILLED_NURSING_FACILITY): Payer: Medicaid Other | Admitting: Adult Health

## 2016-04-23 DIAGNOSIS — K5901 Slow transit constipation: Secondary | ICD-10-CM

## 2016-04-23 DIAGNOSIS — I10 Essential (primary) hypertension: Secondary | ICD-10-CM

## 2016-04-23 DIAGNOSIS — M1712 Unilateral primary osteoarthritis, left knee: Secondary | ICD-10-CM

## 2016-04-23 DIAGNOSIS — Z96659 Presence of unspecified artificial knee joint: Secondary | ICD-10-CM | POA: Insufficient documentation

## 2016-04-23 DIAGNOSIS — Z96652 Presence of left artificial knee joint: Secondary | ICD-10-CM

## 2016-04-23 DIAGNOSIS — R600 Localized edema: Secondary | ICD-10-CM | POA: Diagnosis not present

## 2016-04-23 DIAGNOSIS — J4540 Moderate persistent asthma, uncomplicated: Secondary | ICD-10-CM | POA: Diagnosis not present

## 2016-04-23 DIAGNOSIS — E1143 Type 2 diabetes mellitus with diabetic autonomic (poly)neuropathy: Secondary | ICD-10-CM

## 2016-04-23 DIAGNOSIS — E1169 Type 2 diabetes mellitus with other specified complication: Secondary | ICD-10-CM

## 2016-04-23 DIAGNOSIS — E785 Hyperlipidemia, unspecified: Secondary | ICD-10-CM

## 2016-04-23 MED ORDER — OXYCODONE-ACETAMINOPHEN 5-325 MG PO TABS: 1.0000 | ORAL_TABLET | ORAL | 0 refills | Status: DC | PRN

## 2016-04-23 MED ORDER — LYRICA 150 MG PO CAPS: 150.0000 mg | ORAL_CAPSULE | Freq: Two times a day (BID) | ORAL | 0 refills | Status: DC

## 2016-04-23 NOTE — Telephone Encounter (Signed)
Prescription request was received from:  [facility]  [address] [place] [address]  PHONE: [phone]   Fax: [phone] 

## 2016-04-23 NOTE — Progress Notes (Addendum)
Patient ID: Jennifer [name], female   DOB: [birth year], 45 y.o.   MRN: [id number]    [facility] Room Number: 131-A Place of Service:  SNF (31)   CODE STATUS: Full Code  Allergies  Allergen Reactions  . Adhesive [Tape] Itching and Rash    Chief Complaint  Patient presents with  . Hospitalization Follow-up    Hospital Follow up    HPI:  She has been hospitalized for a left knee replacement. She is here for short term rehab with her goal to return back home. She is not voicing any complaints at this time. There are no nursing concerns at this time.    She is being discharged to home per her choice. I do not recommend this discharge; however; she is insistent.   Past Medical History:  Diagnosis Date  . Allergy    Singulair and Allgra daily  . Arthritis   . Asthma    has inhalers and neb treatments  . Chronic bronchitis (HCC)   . Diabetes (HCC)    states she doesn't check at home.Humalog 75/25 and Basaglar daily as well as Januvia  . History of hiatal hernia   . Hyperlipidemia    takes Lipitor daily  . Hypertension    takes Losartan and Metoprolol daily  . Joint pain   . Joint swelling   . Numbness    both hands  . Peripheral edema    takes Furosemide daily  . Peripheral neuropathy (HCC)    takes Lyrica daily    Past Surgical History:  Procedure Laterality Date  . ABDOMINAL HYSTERECTOMY  2002  . BLADDER SURGERY  2015   sling. Metal battery implanted  . cyst removed from stomach    . EGD with diliation    . FOOT SURGERY Left 2014  . HEMORRHOID SURGERY  2012  . knee arhroscopy Left 2016  . TOTAL KNEE ARTHROPLASTY Left 04/20/2016   Procedure: TOTAL KNEE ARTHROPLASTY;  Surgeon: Gean BirchwoodFrank Rowan, MD;  Location: [facility] OR;  Service: Orthopedics;  Laterality: Left;    Social History   Social History  . Marital status: Single    Spouse name: [name]  . Number of children: [name]  . Years of education: [name]   Occupational History  . Not on file.     Social History Main Topics  . Smoking status: Never Smoker  . Smokeless tobacco: Never Used  . Alcohol use No  . Drug use: No  . Sexual activity: Not on file   Other Topics Concern  . Not on file   Social History Narrative  . No narrative on file   History reviewed. No pertinent family history.    VITAL SIGNS BP 135/84   Pulse 78   Temp 98 F (36.7 C) (Oral)   Resp (!) 24   Ht 5\' 2"  (1.575 m)   Wt 209 lb (94.8 kg)   SpO2 98%   BMI 38.23 kg/m   Patient's Medications  New Prescriptions   No medications on file  Previous Medications   ASMANEX HFA 200 MCG/ACT AERO    Inhale 2 puffs into the lungs daily.   ASPIRIN EC 325 MG TABLET    Take 1 tablet (325 mg total) by mouth 2 (two) times daily.   ATORVASTATIN (LIPITOR) 20 MG TABLET    Take 20 mg by mouth at bedtime.    CHOLECALCIFEROL (VITAMIN D) 2000 UNITS CAPS    Take 2,000 Units by mouth daily.  DOCUSATE SODIUM (COLACE) 100 MG CAPSULE    Take 100 mg by mouth 2 (two) times daily.   FEXOFENADINE (ALLEGRA) 180 MG TABLET    Take 180 mg by mouth at bedtime.    FIBER PO    Take 1 capsule by mouth 2 (two) times daily.   FUROSEMIDE (LASIX) 20 MG TABLET    Take 20 mg by mouth every morning.    IBUPROFEN (ADVIL,MOTRIN) 600 MG TABLET    Take 1 tablet (600 mg total) by mouth every 6 (six) hours as needed.   INSULIN GLARGINE (BASAGLAR KWIKPEN) 100 UNIT/ML SOPN    Inject 25 Units into the skin 2 (two) times daily.   INSULIN LISPRO PROTAMINE-LISPRO (HUMALOG 75/25 MIX) (75-25) 100 UNIT/ML SUSP INJECTION    Inject 15 Units into the skin 3 (three) times daily.   LOSARTAN (COZAAR) 100 MG TABLET    Take 50 mg by mouth daily.    LYRICA 150 MG CAPSULE    Take 150 mg by mouth 2 (two) times daily.   METHOCARBAMOL (ROBAXIN) 500 MG TABLET    Take 1 tablet (500 mg total) by mouth every 6 (six) hours as needed for muscle spasms.   METOPROLOL (LOPRESSOR) 50 MG TABLET    Take 50 mg by mouth daily.    MONTELUKAST (SINGULAIR) 10 MG TABLET    Take 10  mg by mouth at bedtime.   OXYCODONE-ACETAMINOPHEN (ROXICET) 5-325 MG TABLET    Take 1 tablet by mouth every 4 (four) hours as needed.   POLYETHYLENE GLYCOL (MIRALAX / GLYCOLAX) PACKET    Take 17 g by mouth daily.   QVAR 80 MCG/ACT INHALER    Inhale 1-2 puffs into the lungs 2 (two) times daily.   SITAGLIPTIN (JANUVIA) 100 MG TABLET    Take 100 mg by mouth daily.  Modified Medications   No medications on file  Discontinued Medications   IPRATROPIUM-ALBUTEROL (DUONEB) 0.5-2.5 (3) MG/3ML SOLN    Inhale 0.5 mLs into the lungs 2 (two) times daily.     SIGNIFICANT DIAGNOSTIC EXAMS  04-10-16: chest x-ray:  No active cardiopulmonary disease.    LABS REVIEWED:   04-10-16: wbc 10.0; hgb 13.5; hct 40.1; mcv 87.2; plt 357; glucose 60; bun 9; creat 0.85; k+ 3.7; na++ 137 04-20-16: hgb a1c 5.7 04-21-16: wbc 10.7; hgb 11.1; hct 34.0 ;mcv 87.0; plt 275; glucose 123; bun 6; creat 0.75; k+ 3.5; na++ 134 04-22-16: wbc 10.4; hgb 10.5; hct 31.5; mcv 86.3; plt 257    Review of Systems  Constitutional: Negative for malaise/fatigue.  Respiratory: Negative for cough and shortness of breath.   Cardiovascular: Negative for chest pain, palpitations and leg swelling.  Gastrointestinal: Negative for abdominal pain, constipation and heartburn.  Musculoskeletal: Negative for back pain, joint pain and myalgias.  Skin: Negative.   Neurological: Negative for dizziness.  Psychiatric/Behavioral: The patient is nervous/anxious.     Physical Exam  Constitutional: She is oriented to person, place, and time. No distress.  Obese   Eyes: Conjunctivae are normal.  Neck: Neck supple. No JVD present. No thyromegaly present.  Cardiovascular: Normal rate, regular rhythm and intact distal pulses.   Respiratory: Effort normal and breath sounds normal. No respiratory distress. She has no wheezes.  GI: Soft. Bowel sounds are normal. She exhibits no distension. There is no tenderness.  Musculoskeletal: She exhibits no edema.    Able to move all extremities  Is status post left knee replacement; is ambulating with therapy   Lymphadenopathy:    She has  no cervical adenopathy.  Neurological: She is alert and oriented to person, place, and time.  Skin: Skin is warm and dry. She is not diaphoretic.  Incision line without signs of infection present   Psychiatric: She has a normal mood and affect.      ASSESSMENT/ PLAN:  1. Hypertension: will continue lopressor 50 mg daily; cozaar 50 mg daily will monitor  2. Diabetes: hgb a1c 5.7 will continue basaglar 25 units twice daily and humalog mix 75/25: 15 units three times daily and januvia 100 mg daily   3. Dyslipidemia: will continue lipitor 20 mg daily   4. Lower extremity edema: will continue lasix 20 mg daily   5. Asthma: will continue asmanex 2 puffs daily; allegra 180 mg daily; singulair 10 mg daily Qvar 1-2 puffs twice daily  6. Constipation: will continue colace twice daily; fiber daily and miralax daily   7. Peripheral neuropathy: will continue lyrica 150 mg twice daily   8. Osteoarthritis: left knee: is status post left knee replacement: will follow up with orthopedics as indicated; and will continue therapy as directed; will continue advil 600 mg every 6 hours as needed; robaxin 500 mg every 6 hours as needed; percocet 5/325 mg every 4 hours as needed; will continue asa 325 mg twice daily    Time spent with patient 50   minutes >50% time spent counseling; reviewing medical record; tests; labs; and developing future plan of care   MD is aware of resident's narcotic use and is in agreement with current plan of care. We will attempt to wean resident as apropriate   Synthia Innocent NP [facility]  Contact [phone] Monday through Friday 8am- 5pm  After hours call [phone]     Addendum:   She is being discharged to home per her choice. I do not recemmend this discharge; however; the patient is insisting on going home today. She will  need home health for pt/ot/rn: to evaluate and treat as indicated for gait; balance; adl training. She will need her prescriptions written for a 30 day supply of her medications. #10 lyrica 150 mg tabs; #10 percocet 5/325 mg tabs.  The facility is to setup a follow up appointment with her medical provider in the next one to two weeks. She will need a CPM continuous use for 55-60 degrees.

## 2016-04-27 ENCOUNTER — Other Ambulatory Visit: Payer: Self-pay | Admitting: Adult Health

## 2016-05-11 ENCOUNTER — Ambulatory Visit: Payer: Medicaid Other | Attending: Orthopedic Surgery

## 2016-05-11 DIAGNOSIS — M25662 Stiffness of left knee, not elsewhere classified: Secondary | ICD-10-CM | POA: Insufficient documentation

## 2016-05-11 DIAGNOSIS — R600 Localized edema: Secondary | ICD-10-CM | POA: Diagnosis present

## 2016-05-11 DIAGNOSIS — Z96652 Presence of left artificial knee joint: Secondary | ICD-10-CM | POA: Insufficient documentation

## 2016-05-11 DIAGNOSIS — R262 Difficulty in walking, not elsewhere classified: Secondary | ICD-10-CM | POA: Insufficient documentation

## 2016-05-11 NOTE — Patient Instructions (Signed)
Reviewed all HEP and asked her to increase her reps to 20 or more per session or per day.

## 2016-05-11 NOTE — Therapy (Addendum)
[facility] [address] [place], [place], [address] Phone: [phone]   Fax:  [phone]  Physical Therapy Evaluation  Patient Details  Name: Joesph Julyugustine Hannan MRN: [id number] Date of Birth: [birth year] Referring Provider: Gean BirchwoodFrank Rowan, MD  Encounter Date: 05/11/2016 .  Visit 1 of 12     PT End of Session - 05/11/16 1016    Date for PT Re-Evaluation 06/19/16    Start Time : 830 AM       Stop time  936     PT Time 66 min                       Activity tolerance:WNL             Behavior  : WNL  Past Medical History:  Diagnosis Date  . Allergy    Singulair and Allgra daily  . Arthritis   . Asthma    has inhalers and neb treatments  . Chronic bronchitis (HCC)   . Diabetes (HCC)    states she doesn't check at home.Humalog 75/25 and Basaglar daily as well as Januvia  . History of hiatal hernia   . Hyperlipidemia    takes Lipitor daily  . Hypertension    takes Losartan and Metoprolol daily  . Joint pain   . Joint swelling   . Numbness    both hands  . Peripheral edema    takes Furosemide daily  . Peripheral neuropathy (HCC)    takes Lyrica daily    Past Surgical History:  Procedure Laterality Date  . ABDOMINAL HYSTERECTOMY  2002  . BLADDER SURGERY  2015   sling. Metal battery implanted  . cyst removed from stomach    . EGD with diliation    . FOOT SURGERY Left 2014  . HEMORRHOID SURGERY  2012  . knee arhroscopy Left 2016  . TOTAL KNEE ARTHROPLASTY Left 04/20/2016   Procedure: TOTAL KNEE ARTHROPLASTY;  Surgeon: Gean BirchwoodFrank Rowan, MD;  Location: [facility];  Service: Orthopedics;  Laterality: Left;    There were no vitals filed for this visit.       Subjective Assessment - 05/11/16 0913    Subjective She report OA reason for TKA.  She reports fall in shower and had a scope surgery 02/2015.    She moved to [place] in past year .  She was walking with a walker for past year.      Limitations House hold  activities;Walking;Standing  self care dressing,    How long can you sit comfortably? 30 min or more   How long can you stand comfortably? 20 min   How long can you walk comfortably? houshold distances   Patient Stated Goals To get  Lt knee like RT.    Currently in Pain? Yes   Pain Score 8   with meds  Prior to surgery pain was severe.   Pain Location Knee   Pain Orientation Left;Anterior;Posterior;Medial   Pain Descriptors / Indicators Burning;Throbbing   Pain Type Surgical pain  on chronic pain.    Pain Radiating Towards pain to foot LT   Pain Onset More than a month ago   Pain Frequency Constant   Aggravating Factors  standing, walking   Pain Relieving Factors Medication, ice   Multiple Pain Sites No            [facility] PT Assessment - 05/11/16 0856      Assessment   Medical Diagnosis LT TKA  Referring Provider Gean BirchwoodFrank Rowan, MD   Onset Date/Surgical Date 04/20/16   Next MD Visit 05/27/16   Prior Therapy In hospital,  HHPT 4 visits      Precautions   Precautions None     Restrictions   Weight Bearing Restrictions No     Balance Screen   Has the patient fallen in the past 6 months No   Has the patient had a decrease in activity level because of a fear of falling?  Yes  due to surgery   Is the patient reluctant to leave their home because of a fear of falling?  No     Home Environment   Living Environment Private residence   Living Arrangements Alone   Type of Home House   Home Access Stairs to enter   Home Layout One level     Prior Function   Level of Independence Requires assistive device for independence  she is limited but does not get assitance   Vocation Unemployed     Cognition   Overall Cognitive Status Within Functional Limits for tasks assessed     Observation/Other Assessments-Edema    Edema Circumferential     Circumferential Edema   Circumferential - Right 37 cm   Circumferential - Left  40 cm     Posture/Postural Control   Posture  Comments LT knee flexion/extension  contracture     ROM / Strength   AROM / PROM / Strength AROM;PROM;Strength     AROM   AROM Assessment Site Knee   Right/Left Knee Right;Left   Right Knee Extension 180   Right Knee Flexion 130   Left Knee Extension -37   Left Knee Flexion 81     PROM   PROM Assessment Site Knee   Right/Left Knee Left   Left Knee Extension -32   Left Knee Flexion 85     Strength   Overall Strength Comments hip aduciton RT 4/5   Strength Assessment Site Knee   Right/Left Knee Right;Left   Right Knee Flexion 5/5   Right Knee Extension 5/5   Left Knee Flexion 5/5   Left Knee Extension 5/5     Flexibility   Soft Tissue Assessment /Muscle Length yes   Hamstrings 80 degrees     Palpation   Patella mobility stiffness all planes   Palpation comment tender around patell and generally anterior LT knee      Ambulation/Gait   Gait Comments Rolling walker decreasdd extension in swing and stance decreased weight to LT leg.                    PT Long Term Goals - 05/11/16 1013      PT LONG TERM GOAL #1   Title She will be independent with all HEP   Baseline ned to review HHPT program and progress as able   Time 6   Period Weeks     PT LONG TERM GOAL #2   Title Improve active extension  -20 degrees to improve gait mechanics   Baseline -37 at eval   Time 6   Period Weeks   Status New     PT LONG TERM GOAL #3   Title Walk 500 feet or more for community access ease   Baseline household distances only   Time 6   Period Weeks   Status New     PT LONG TERM GOAL #4   Title improve active flexion to 110 degrees to improve ambualtion and  ability to perform normal home tasks and self care dressing LE.     Baseline 81 degrees at eval   Time 6   Period Weeks   Status New     PT LONG TERM GOAL #5   Title Pain decreased 25% or more with normal activity at home   Baseline 8/10 at eval   Time 6   Period Weeks   Status New     Additional Long  Term Goals   Additional Long Term Goals Yes     PT LONG TERM GOAL #6   Title decrease edema to 38 CM to improve ROM   Baseline 40 cm at eval   Time 6   Period Weeks   Status New                  PT Education - 05/11/16 1610    Education provided Yes   Education Details POC, Medicaid limits , Need to ice and elevate due to edema, increase reps with HEP   Person(s) Educated Patient   Methods Explanation;Verbal cues;Handout   Comprehension Returned demonstration;Verbalized understanding      Reviewed HHPT HEP              Plan - 05/11/16 0847    Clinical Impression Statement Ms Mulhall presents for moderate complexity eval post LT TKA ( CPT  [phone], [phone]) with pain, swelling , weakness making walking and mobility difficult.  Long term knee pain in past year will limit progress  as she most likly also had stiffness prior to surgery.    Rehab Potential Good   PT Frequency 2x / week   PT Duration 6 weeks   PT Treatment/Interventions Electrical Stimulation;Cryotherapy;Moist Heat;Gait training;Passive range of motion;Patient/family education;Vasopneumatic Device;Taping;Manual techniques;Therapeutic exercise;Stair training   PT Next Visit Plan Strengthening , ROM , manual, modalities,   Emphasis on ROM   PT Home Exercise Plan REviewed HHPT program    Consulted and Agree with Plan of Care Patient      Patient will benefit from skilled therapeutic intervention in order to improve the following deficits and impairments:  Decreased range of motion, Difficulty walking, Pain, Increased edema, Decreased strength, Decreased activity tolerance, Increased muscle spasms  Visit Diagnosis: History of total left knee replacement - Plan: PT plan of care cert/re-cert  Stiffness of left knee, not elsewhere classified - Plan: PT plan of care cert/re-cert  Localized edema - Plan: PT plan of care cert/re-cert  Difficulty in walking, not elsewhere classified - Plan: PT plan of care  cert/re-cert     Problem List Patient Active Problem List   Diagnosis Date Noted  . S/P knee replacement 04/23/2016  . Essential hypertension, benign 04/23/2016  . Dyslipidemia associated with type 2 diabetes mellitus (HCC) 04/23/2016  . 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 Bilateral edema of lower extremity 04/23/2016  . Asthma in adult, moderate persistent, uncomplicated 04/23/2016  . Slow transit constipation 04/23/2016  . Primary osteoarthritis of left knee 04/19/2016    Caprice Red  PT 05/11/2016, 10:20 AM  [facility] [address] [place], [place], [address] Phone: [phone]   Fax:  [phone]  Name: Javanna Patin MRN: [id number] Date of Birth: June 16, 1971

## 2016-05-25 ENCOUNTER — Ambulatory Visit: Payer: Medicaid Other | Admitting: Physical Therapy

## 2016-05-25 DIAGNOSIS — M25662 Stiffness of left knee, not elsewhere classified: Secondary | ICD-10-CM

## 2016-05-25 DIAGNOSIS — R600 Localized edema: Secondary | ICD-10-CM

## 2016-05-25 DIAGNOSIS — R262 Difficulty in walking, not elsewhere classified: Secondary | ICD-10-CM

## 2016-05-25 DIAGNOSIS — Z96652 Presence of left artificial knee joint: Secondary | ICD-10-CM

## 2016-05-25 NOTE — Therapy (Signed)
[facility] [address] [place], [place], [address] Phone: [phone]   Fax:  [phone]  Physical Therapy Treatment  Patient Details  Name: Jennifer [name] MRN: [id number] Date of Birth: Oct 26, 1970 Referring Provider: Gean BirchwoodFrank Rowan, MD  Encounter Date: 05/25/2016      PT End of Session - 05/25/16 1052    Visit Number 2   Number of Visits 12   Date for PT Re-Evaluation 06/19/16   Authorization Type Medicaid   PT Start Time 1015   PT Stop Time 1115   PT Time Calculation (min) 60 min      Past Medical History:  Diagnosis Date  . Allergy    Singulair and Allgra daily  . Arthritis   . Asthma    has inhalers and neb treatments  . Chronic bronchitis (HCC)   . Diabetes (HCC)    states she doesn't check at home.Humalog 75/25 and Basaglar daily as well as Januvia  . History of hiatal hernia   . Hyperlipidemia    takes Lipitor daily  . Hypertension    takes Losartan and Metoprolol daily  . Joint pain   . Joint swelling   . Numbness    both hands  . Peripheral edema    takes Furosemide daily  . Peripheral neuropathy (HCC)    takes Lyrica daily    Past Surgical History:  Procedure Laterality Date  . ABDOMINAL HYSTERECTOMY  2002  . BLADDER SURGERY  2015   sling. Metal battery implanted  . cyst removed from stomach    . EGD with diliation    . FOOT SURGERY Left 2014  . HEMORRHOID SURGERY  2012  . knee arhroscopy Left 2016  . TOTAL KNEE ARTHROPLASTY Left 04/20/2016   Procedure: TOTAL KNEE ARTHROPLASTY;  Surgeon: Gean BirchwoodFrank Rowan, MD;  Location: [facility];  Service: Orthopedics;  Laterality: Left;    There were no vitals filed for this visit.      Subjective Assessment - 05/25/16 1137    Subjective I have been paining. I called my doctor. He said we will discuss surgery to remove scar tissue at my appointment next week.    Currently in Pain? Yes   Pain Score 5    Pain Location Knee   Pain Orientation Left   Pain  Descriptors / Indicators Numbness;Burning;Throbbing;Aching   Aggravating Factors  standing, walking   Pain Relieving Factors meds    Multiple Pain Sites Yes   Pain Score 6   Pain Location Foot   Pain Orientation Left   Pain Descriptors / Indicators Aching            [facility] PT Assessment - 05/25/16 0001      AROM   Left Knee Extension -30   Left Knee Flexion 88                     [facility] Adult PT Treatment/Exercise - 05/25/16 0001      Self-Care   Self-Care Scar Mobilizations;Other Self-Care Comments   Scar Mobilizations circular and across motions with cocoa butter or lotion   Other Self-Care Comments  desentization using massage, different textures.      Knee/Hip Exercises: Stretches   Active Hamstring Stretch 3 reps;30 seconds   Active Hamstring Stretch Limitations sheet   Knee: Self-Stretch to increase Flexion 3 reps;20 seconds   Knee: Self-Stretch Limitations with sheet     Knee/Hip Exercises: Aerobic   Nustep Nustep L1 UE/LE x 7 minutes  Knee/Hip Exercises: Seated   Other Seated Knee/Hip Exercises Seated quad set 5 sec x 10    Other Seated Knee/Hip Exercises DF stretch with sheet 2 x 30 sec, ankle AROM, circles     Knee/Hip Exercises: Supine   Quad Sets 10 reps   Heel Slides 5 reps     Modalities   Modalities Cryotherapy     Cryotherapy   Number Minutes Cryotherapy 10 Minutes   Cryotherapy Location Knee   Type of Cryotherapy Ice pack                PT Education - 05/25/16 1111    Education provided Yes   Education Details HEP, scar massage   Person(s) Educated Patient   Methods Explanation;Handout   Comprehension Verbalized understanding             PT Long Term Goals - 05/11/16 1013      PT LONG TERM GOAL #1   Title She will be independent with all HEP   Baseline ned to review HHPT program and progress as able   Time 6   Period Weeks     PT LONG TERM GOAL #2   Title Improve active extension  -20 degrees to  improve gait mechanics   Baseline -37 at eval   Time 6   Period Weeks   Status New     PT LONG TERM GOAL #3   Title Walk 500 feet or more for community access ease   Baseline household distances only   Time 6   Period Weeks   Status New     PT LONG TERM GOAL #4   Title improve active flexion to 110 degrees to improve ambualtion and ability to perform normal home tasks and self care dressing LE.     Baseline 81 degrees at eval   Time 6   Period Weeks   Status New     PT LONG TERM GOAL #5   Title Pain decreased 25% or more with normal activity at home   Baseline 8/10 at eval   Time 6   Period Weeks   Status New     Additional Long Term Goals   Additional Long Term Goals Yes     PT LONG TERM GOAL #6   Title decrease edema to 38 CM to improve ROM   Baseline 40 cm at eval   Time 6   Period Weeks   Status New               Plan - 05/25/16 1137    Clinical Impression Statement Ms Furuya reports increased pain after PROM last visit. She is performing HHPT HEP 1-2 x per day. We reviewed her HHPT HEP and added ROM stretches and seated quad set. She is currently not perfroming any stretches, only AROM. She demonstrates hypersensitivity in area of scar. Increased time spent on demonstration of scar massage and desensitization to anterior knee. Her AROM has slightly improved since last visit.    PT Next Visit Plan Strengthening , ROM , manual, modalities,   Emphasis on ROM   PT Home Exercise Plan HHPT program (AROM Heel slides, supine quad set, mini squats, standing 3 way hip, standing hamstring curl) added hamstring stretch with strap, heel slide with strap, seated quad set.    Consulted and Agree with Plan of Care Patient      Patient will benefit from skilled therapeutic intervention in order to improve the following deficits and impairments:  Decreased range  of motion, Difficulty walking, Pain, Increased edema, Decreased strength, Decreased activity tolerance, Increased  muscle spasms  Visit Diagnosis: History of total left knee replacement  Stiffness of left knee, not elsewhere classified  Localized edema  Difficulty in walking, not elsewhere classified     Problem List Patient Active Problem List   Diagnosis Date Noted  . S/P knee replacement 04/23/2016  . Essential hypertension, benign 04/23/2016  . Dyslipidemia associated with type 2 diabetes mellitus (HCC) 04/23/2016  . 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 Bilateral edema of lower extremity 04/23/2016  . Asthma in adult, moderate persistent, uncomplicated 04/23/2016  . Slow transit constipation 04/23/2016  . Primary osteoarthritis of left knee 04/19/2016    Sherrie Mustacheonoho, Quentina Fronek McGee, PTA 05/25/2016, 2:31 PM  [facility] [address] [place], [place], [address] Phone: [phone]   Fax:  [phone]  Name: Jennifer Julyugustine Paparella MRN: [id number] Date of Birth: January 13, 1971

## 2016-05-28 ENCOUNTER — Ambulatory Visit: Payer: Medicaid Other | Admitting: Physical Therapy

## 2016-05-28 DIAGNOSIS — M25662 Stiffness of left knee, not elsewhere classified: Secondary | ICD-10-CM

## 2016-05-28 DIAGNOSIS — Z96652 Presence of left artificial knee joint: Secondary | ICD-10-CM | POA: Diagnosis not present

## 2016-05-28 DIAGNOSIS — R262 Difficulty in walking, not elsewhere classified: Secondary | ICD-10-CM

## 2016-05-28 DIAGNOSIS — R600 Localized edema: Secondary | ICD-10-CM

## 2016-05-28 NOTE — Therapy (Signed)
[facility] [address] [place], [place], [address] Phone: [phone]   Fax:  [phone]  Physical Therapy Treatment  Patient Details  Name: Jennifer [name] MRN: [id number] Date of Birth: Jun 26, 1971 Referring Provider: Gean BirchwoodFrank Rowan, MD  Encounter Date: 05/28/2016      PT End of Session - 05/28/16 1116    Visit Number 3   Number of Visits 12   Date for PT Re-Evaluation 06/19/16   Authorization Type Medicaid   PT Start Time 1100   PT Stop Time 1155   PT Time Calculation (min) 55 min      Past Medical History:  Diagnosis Date  . Allergy    Singulair and Allgra daily  . Arthritis   . Asthma    has inhalers and neb treatments  . Chronic bronchitis (HCC)   . Diabetes (HCC)    states she doesn't check at home.Humalog 75/25 and Basaglar daily as well as Januvia  . History of hiatal hernia   . Hyperlipidemia    takes Lipitor daily  . Hypertension    takes Losartan and Metoprolol daily  . Joint pain   . Joint swelling   . Numbness    both hands  . Peripheral edema    takes Furosemide daily  . Peripheral neuropathy (HCC)    takes Lyrica daily    Past Surgical History:  Procedure Laterality Date  . ABDOMINAL HYSTERECTOMY  2002  . BLADDER SURGERY  2015   sling. Metal battery implanted  . cyst removed from stomach    . EGD with diliation    . FOOT SURGERY Left 2014  . HEMORRHOID SURGERY  2012  . knee arhroscopy Left 2016  . TOTAL KNEE ARTHROPLASTY Left 04/20/2016   Procedure: TOTAL KNEE ARTHROPLASTY;  Surgeon: Gean BirchwoodFrank Rowan, MD;  [facility]: [facility];  Service: Orthopedics;  Laterality: Left;    There were no vitals filed for this visit.      Subjective Assessment - 05/28/16 1115    Subjective My pain is high at night. My pain is zero. It's pain but not too much pain.    Currently in Pain? No/denies                         [facility]/Exercise - 05/28/16 0001      Knee/Hip  Exercises: Stretches   Active Hamstring Stretch 3 reps;30 seconds   Active Hamstring Stretch Limitations sheet     Knee/Hip Exercises: Aerobic   Recumbent Bike Partial to full revolutions, 3 backward  full revolutions    Nustep Nustep L1 UE/LE x 7 minutes     Knee/Hip Exercises: Seated   Other Seated Knee/Hip Exercises Seated quad set 5 sec x 10    Other Seated Knee/Hip Exercises DF stretch with sheet 2 x 30 sec, ankle AROM, circles     Knee/Hip Exercises: Supine   Quad Sets 10 reps   Heel Slides 10 reps   Straight Leg Raises 10 reps   Patellar Mobs all planes     Knee/Hip Exercises: Sidelying   Hip ABduction 10 reps     Cryotherapy   Number Minutes Cryotherapy 10 Minutes   Cryotherapy Location Knee   Type of Cryotherapy Ice pack                     PT Long Term Goals - 05/11/16 1013      PT LONG TERM GOAL #1  Title She will be independent with all HEP   Baseline ned to review HHPT program and progress as able   Time 6   Period Weeks     PT LONG TERM GOAL #2   Title Improve active extension  -20 degrees to improve gait mechanics   Baseline -37 at eval   Time 6   Period Weeks   Status New     PT LONG TERM GOAL #3   Title Walk 500 feet or more for community access ease   Baseline household distances only   Time 6   Period Weeks   Status New     PT LONG TERM GOAL #4   Title improve active flexion to 110 degrees to improve ambualtion and ability to perform normal home tasks and self care dressing LE.     Baseline 81 degrees at eval   Time 6   Period Weeks   Status New     PT LONG TERM GOAL #5   Title Pain decreased 25% or more with normal activity at home   Baseline 8/10 at eval   Time 6   Period Weeks   Status New     Additional Long Term Goals   Additional Long Term Goals Yes     PT LONG TERM GOAL #6   Title decrease edema to 38 CM to improve ROM   Baseline 40 cm at eval   Time 6   Period Weeks   Status New                Plan - 05/28/16 1152    Clinical Impression Statement Jennifer [name] saw MD who told her she was doing great. He recommended she put her foot on a pillow in bed and try to straighten the knee. This caused her alot of pain last night. She is currently in a hotel due to home remodel and has no access to sheet ot belt to perform HEP. Pt continues to be guarded and requires encouragement with all therex. Patella restricted. Mobs to loosen prior to aerobic machines. Trial of Rec bike. Pt able to perfrom 3 full revolutions backward, too much pain with forward revolutions. AAROM 20-91 today.    PT Next Visit Plan Strengthening , ROM , manual, modalities,   Emphasis on ROM; Bike again.    PT Home Exercise Plan HHPT program (AROM Heel slides, supine quad set, mini squats, standing 3 way hip, standing hamstring curl) added hamstring stretch with strap, heel slide with strap, seated quad set.    Consulted and Agree with Plan of Care Patient      Patient will benefit from skilled therapeutic intervention in order to improve the following deficits and impairments:  Decreased range of motion, Difficulty walking, Pain, Increased edema, Decreased strength, Decreased activity tolerance, Increased muscle spasms  Visit Diagnosis: Stiffness of left knee, not elsewhere classified  Localized edema  Difficulty in walking, not elsewhere classified  History of total left knee replacement     Problem List Patient Active Problem List   Diagnosis Date Noted  . S/P knee replacement 04/23/2016  . Essential hypertension, benign 04/23/2016  . Dyslipidemia associated with type 2 diabetes mellitus (HCC) 04/23/2016  . 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 Bilateral edema of lower extremity 04/23/2016  . Asthma in adult, moderate persistent, uncomplicated 04/23/2016  . Slow transit constipation 04/23/2016  . Primary osteoarthritis of left knee 04/19/2016    Sherrie Mustache, PTA  05/28/2016, 12:08 PM  [facility] [address] [place], [place], [address] Phone: [phone]   Fax:  [phone]  Name: Jennifer [name] MRN: [id number] Date of Birth: 10/15/1970

## 2016-06-01 ENCOUNTER — Encounter: Payer: Self-pay | Admitting: Physical Therapy

## 2016-06-01 ENCOUNTER — Ambulatory Visit: Payer: Medicaid Other | Attending: Orthopedic Surgery | Admitting: Physical Therapy

## 2016-06-01 DIAGNOSIS — R262 Difficulty in walking, not elsewhere classified: Secondary | ICD-10-CM | POA: Insufficient documentation

## 2016-06-01 DIAGNOSIS — R600 Localized edema: Secondary | ICD-10-CM | POA: Diagnosis present

## 2016-06-01 DIAGNOSIS — Z96652 Presence of left artificial knee joint: Secondary | ICD-10-CM | POA: Diagnosis present

## 2016-06-01 DIAGNOSIS — M25662 Stiffness of left knee, not elsewhere classified: Secondary | ICD-10-CM

## 2016-06-01 NOTE — Therapy (Addendum)
[facility] [address] [place], [place], [address] Phone: [phone]   Fax:  [phone]  Physical Therapy Treatment  Patient Details  Name: Jennifer [name] MRN: [id number] Date of Birth: 1970/11/03 Referring Provider: Gean Birchwood, MD  Encounter Date: 06/01/2016      PT End of Session - 06/01/16 1029    Visit Number 4   Number of Visits 12   Date for PT Re-Evaluation 06/19/16   Authorization Type Medicaid approved 3 visits 11/29-1/9   Authorization - Visit Number 2   Authorization - Number of Visits 3   PT Start Time 1018   PT Stop Time 1117   PT Time Calculation (min) 59 min   Activity Tolerance Patient tolerated treatment well   Behavior During Therapy [facility] for tasks assessed/performed      Past Medical History:  Diagnosis Date  . Allergy    Singulair and Allgra daily  . Arthritis   . Asthma    has inhalers and neb treatments  . Chronic bronchitis (HCC)   . Diabetes (HCC)    states she doesn't check at home.Humalog 75/25 and Basaglar daily as well as Januvia  . History of hiatal hernia   . Hyperlipidemia    takes Lipitor daily  . Hypertension    takes Losartan and Metoprolol daily  . Joint pain   . Joint swelling   . Numbness    both hands  . Peripheral edema    takes Furosemide daily  . Peripheral neuropathy (HCC)    takes Lyrica daily    Past Surgical History:  Procedure Laterality Date  . ABDOMINAL HYSTERECTOMY  2002  . BLADDER SURGERY  2015   sling. Metal battery implanted  . cyst removed from stomach    . EGD with diliation    . FOOT SURGERY Left 2014  . HEMORRHOID SURGERY  2012  . knee arhroscopy Left 2016  . TOTAL KNEE ARTHROPLASTY Left 04/20/2016   Procedure: TOTAL KNEE ARTHROPLASTY;  Surgeon: Gean Birchwood, MD;  Location: [facility] OR;  Service: Orthopedics;  Laterality: Left;    There were no vitals filed for this visit.      Subjective Assessment - 06/01/16 1025    Subjective Pt reports  pain is doing okay. Did stretching at home but cannot hold for 30s bc it makes her sweat and she has asthma.    Patient Stated Goals To get  Lt knee like RT.    Currently in Pain? No/denies   Pain Location Knee   Pain Orientation Left   Pain Descriptors / Indicators Burning            [facility] PT Assessment - 06/01/16 0001      AROM   Left Knee Extension -15   Left Knee Flexion 102                     [facility] Adult PT Treatment/Exercise - 06/01/16 0001      Exercises   Exercises Knee/Hip     Knee/Hip Exercises: Stretches   Knee: Self-Stretch to increase Flexion 4 reps;20 seconds   Knee: Self-Stretch Limitations with green strap   Gastroc Stretch 2 reps;30 seconds   Gastroc Stretch Limitations slant board     Knee/Hip Exercises: Aerobic   Recumbent Bike avialable ROM     Knee/Hip Exercises: Standing   Gait Training heel toe pattern, knee flexion in swing through   Other Standing Knee Exercises heel toe raises  Knee/Hip Exercises: Supine   Short Arc Quad Sets 10 reps  5s holds   Short Arc Quad Sets Limitations 2#   Straight Leg Raises 15 reps  cues for quad set     Knee/Hip Exercises: Prone   Other Prone Exercises prone glut + quad set with knee ext     Cryotherapy   Number Minutes Cryotherapy 10 Minutes   Cryotherapy Location Knee   Type of Cryotherapy Ice pack     Manual Therapy   Manual Therapy Joint mobilization;Soft tissue mobilization   Manual therapy comments AP pressure through disal femur during soft tissue work to encourage ext   Joint Mobilization sup/inf patellar mobs   Soft tissue mobilization scar mob, soft tissue mob around knee                PT Education - 06/01/16 1117    Education provided Yes   Education Details HEP, sensory changes, exercise form/rationale   Person(s) Educated Patient   Methods Explanation;Demonstration;Tactile cues;Verbal cues   Comprehension Verbalized understanding;Returned  demonstration;Verbal cues required;Tactile cues required;Need further instruction             PT Long Term Goals - 05/11/16 1013      PT LONG TERM GOAL #1   Title She will be independent with all HEP   Baseline ned to review HHPT program and progress as able   Time 6   Period Weeks     PT LONG TERM GOAL #2   Title Improve active extension  -20 degrees to improve gait mechanics   Baseline -37 at eval   Time 6   Period Weeks   Status New     PT LONG TERM GOAL #3   Title Walk 500 feet or more for community access ease   Baseline household distances only   Time 6   Period Weeks   Status New     PT LONG TERM GOAL #4   Title improve active flexion to 110 degrees to improve ambualtion and ability to perform normal home tasks and self care dressing LE.     Baseline 81 degrees at eval   Time 6   Period Weeks   Status New     PT LONG TERM GOAL #5   Title Pain decreased 25% or more with normal activity at home   Baseline 8/10 at eval   Time 6   Period Weeks   Status New     Additional Long Term Goals   Additional Long Term Goals Yes     PT LONG TERM GOAL #6   Title decrease edema to 38 CM to improve ROM   Baseline 40 cm at eval   Time 6   Period Weeks   Status New               Plan - 06/01/16 1110    Clinical Impression Statement Significant improvements in ROM continue to be evident and pt was encouraged to continue to do her HEP twice daily to continue improvements. Discussed sensory changes with healing and increased exercises. Pt was instructed to perofrm HEP again tonight because she will likely be sore from increased exercises at therapy today. pt will continue to benefit from skilled PT in order to continue improving functional strength and ROM in TKA.    PT Treatment/Interventions Electrical Stimulation;Cryotherapy;Moist Heat;Gait training;Passive range of motion;Patient/family education;Vasopneumatic Device;Taping;Manual techniques;Therapeutic  exercise;Stair training   PT Next Visit Plan Strengthening , ROM , manual, modalities,   Emphasis  on ROM; Bike again.    **Discuss POC   PT Home Exercise Plan HHPT program (AROM Heel slides, supine quad set, mini squats, standing 3 way hip, standing hamstring curl) added hamstring stretch with strap, heel slide with strap, seated quad set.    Consulted and Agree with Plan of Care Patient      Patient will benefit from skilled therapeutic intervention in order to improve the following deficits and impairments:  Decreased range of motion, Difficulty walking, Pain, Increased edema, Decreased strength, Decreased activity tolerance, Increased muscle spasms  Visit Diagnosis: Stiffness of left knee, not elsewhere classified  Localized edema  Difficulty in walking, not elsewhere classified     Problem List Patient Active Problem List   Diagnosis Date Noted  . S/P knee replacement 04/23/2016  . Essential hypertension, benign 04/23/2016  . Dyslipidemia associated with type 2 diabetes mellitus (HCC) 04/23/2016  . 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 Bilateral edema of lower extremity 04/23/2016  . Asthma in adult, moderate persistent, uncomplicated 04/23/2016  . Slow transit constipation 04/23/2016  . Primary osteoarthritis of left knee 04/19/2016    Todrick Siedschlag C. Arissa Fagin PT, DPT 06/01/16 12:45 PM   [facility] [address] [place], [place], [address] Phone: [phone]   Fax:  [phone]  Name: Jennifer [name] MRN: [id number] Date of Birth: December 30, 1970

## 2016-06-04 ENCOUNTER — Ambulatory Visit: Payer: Medicaid Other | Admitting: Physical Therapy

## 2016-06-04 DIAGNOSIS — Z96652 Presence of left artificial knee joint: Secondary | ICD-10-CM

## 2016-06-04 DIAGNOSIS — R262 Difficulty in walking, not elsewhere classified: Secondary | ICD-10-CM

## 2016-06-04 DIAGNOSIS — R600 Localized edema: Secondary | ICD-10-CM

## 2016-06-04 DIAGNOSIS — M25662 Stiffness of left knee, not elsewhere classified: Secondary | ICD-10-CM

## 2016-06-04 NOTE — Therapy (Addendum)
[place], [place], [address] Phone: [phone]   Fax:  [phone]  Physical Therapy Treatment/ Discharge  Patient Details  Name: Jennifer [name] MRN: [id number] Date of Birth: 15-May-1971 Referring Provider: Frederik Pear, MD  Encounter Date: 06/04/2016      PT End of Session - 06/04/16 1021    Visit Number 5   Number of Visits 12   Date for PT Re-Evaluation 06/19/16   Authorization Type Medicaid approved 3 visits 11/29-1/9   Authorization - Visit Number 3   Authorization - Number of Visits 3   PT Start Time 9983   PT Stop Time 1115   PT Time Calculation (min) 59 min      Past Medical History:  Diagnosis Date  . Allergy    Singulair and Allgra daily  . Arthritis   . Asthma    has inhalers and neb treatments  . Chronic bronchitis ([place])   . Diabetes ([place])    states she doesn't check at home.Humalog 75/25 and Basaglar daily as well as Januvia  . History of hiatal hernia   . Hyperlipidemia    takes Lipitor daily  . Hypertension    takes Losartan and Metoprolol daily  . Joint pain   . Joint swelling   . Numbness    both hands  . Peripheral edema    takes Furosemide daily  . Peripheral neuropathy (HCC)    takes Lyrica daily    Past Surgical History:  Procedure Laterality Date  . ABDOMINAL HYSTERECTOMY  2002  . BLADDER SURGERY  2015   sling. Metal battery implanted  . cyst removed from stomach    . EGD with diliation    . FOOT SURGERY Left 2014  . [place]  2012  . knee arhroscopy Left 2016  . TOTAL KNEE ARTHROPLASTY Left 04/20/2016   Procedure: TOTAL KNEE ARTHROPLASTY;  Surgeon: Frederik Pear, MD;  Location: [facility];  Service: Orthopedics;  Laterality: Left;    There were no vitals filed for this visit.      Subjective Assessment - 06/04/16 1122    Subjective My knee was swollen and painful for 2 days after last time.    Currently in Pain? No/denies             [facility] PT Assessment - 06/04/16 0001      Circumferential Edema   Circumferential - Right 37 cm   Circumferential - Left  39 cm     AROM   Left Knee Extension -15   Left Knee Flexion 102  104 AAROM                     [facility] Adult PT Treatment/Exercise - 06/04/16 0001      Ambulation/Gait   Ambulation/Gait Yes   Ambulation/Gait Assistance 6: Modified independent (Device/Increase time)   Ambulation Distance (Feet) 100 Feet   Assistive device Straight cane   Gait Pattern Antalgic   Ambulation Surface Level;Indoor   Stairs Yes   Stairs Assistance 6: Modified independent (Device/Increase time)   Stair Management Technique One rail Right;Alternating pattern   Number of Stairs 12   Height of Stairs 6   Pre-Gait Activities Roll thru on righ and left, retro walking for quad activation     Knee/Hip Exercises: Stretches   Active Hamstring Stretch 3 reps;30 seconds   Active Hamstring Stretch Limitations long sitting -HEP   Knee: Self-Stretch to increase Flexion 30 seconds;3 reps   Knee: Self-Stretch  Limitations with green strap     Knee/Hip Exercises: Aerobic   Recumbent Bike 5 minutes level 1      Knee/Hip Exercises: Standing   Other Standing Knee Exercises Tandem stance LLE back x 1 minute no LOB, SLS left 5 seconds    Other Standing Knee Exercises Terminal knee extension with red band , 3 way in door way - HEP      Knee/Hip Exercises: Seated   Other Seated Knee/Hip Exercises Seated quad set 5 sec x 10    Other Seated Knee/Hip Exercises DF stretch with sheet 2 x 30 sec     Cryotherapy   Number Minutes Cryotherapy 15 Minutes   Cryotherapy Location Knee   Type of Cryotherapy Ice pack                PT Education - 06/04/16 1106    Education provided Yes   Education Details HEP   Person(s) Educated Patient   Methods Explanation;Handout   Comprehension Verbalized understanding             PT Long Term Goals - 06/04/16 1116      PT LONG TERM  GOAL #1   Title She will be independent with all HEP   Time 6   Period Weeks   Status Achieved     PT LONG TERM GOAL #2   Title Improve active extension  -20 degrees to improve gait mechanics   Baseline -15   Time 6   Period Weeks   Status Achieved     PT LONG TERM GOAL #3   Title Walk 500 feet or more for community access ease   Time 6   Period Weeks   Status Unable to assess     PT LONG TERM GOAL #4   Title improve active flexion to 110 degrees to improve ambualtion and ability to perform normal home tasks and self care dressing LE.     Baseline 102   Time 6   Period Weeks   Status Not Met     PT LONG TERM GOAL #5   Title Pain decreased 25% or more with normal activity at home   Baseline no pain except after PT    Time 6   Period Weeks   Status Achieved     PT LONG TERM GOAL #6   Title decrease edema to 38 CM to improve ROM   Baseline 39 cm   Time 6   Period Weeks   Status Not Met               Plan - 06/04/16 1122    Clinical Impression Statement Pt reports no pain, only burning in knee. She did have increased swelling and pain after PT up to 6-7/10 , better today. Edema reduced by 1 cm compared to eval. ROM unchanged since last visit. Focused knee extension and functional mobility. This is her last covered visit and she declines to continue as self pay. She gained 3 degrees of extension after standing terminal knee exercises so added to HEP. Her MD is sending her a referral for a new rollator however she is ready for a SPC. SHe plans to request an order for a cane. Reviewed proper gait mechanics with pt limited due to lack of knee extension. Gait training on stairs with and without SPC. Pt able to negotiate stairs up and down alternating pattern without pain or difficulty. Ambulation in clinic with SPC in safe. See goals met. Discharge to HEP  due to financial reasons.    PT Next Visit Plan discharge today    PT Home Exercise Plan HHPT program (AROM Heel slides,  supine quad set, mini squats, standing 3 way hip, standing hamstring curl) added hamstring stretch with strap, heel slide with strap, seated quad set. terminal knee extenion red band standing, long sitting hamstring stretch.    Consulted and Agree with Plan of Care Patient      Patient will benefit from skilled therapeutic intervention in order to improve the following deficits and impairments:  Decreased range of motion, Difficulty walking, Pain, Increased edema, Decreased strength, Decreased activity tolerance, Increased muscle spasms  Visit Diagnosis: Stiffness of left knee, not elsewhere classified  Localized edema  Difficulty in walking, not elsewhere classified  History of total left knee replacement     Problem List Patient Active Problem List   Diagnosis Date Noted  . S/P knee replacement 04/23/2016  . Essential hypertension, benign 04/23/2016  . Dyslipidemia associated with type 2 diabetes mellitus ([place]) 04/23/2016  . 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 Bilateral edema of lower extremity 04/23/2016  . Asthma in adult, moderate persistent, uncomplicated 14/70/9295  . Slow transit constipation 04/23/2016  . Primary osteoarthritis of left knee 04/19/2016    Dorene Ar, [place] 06/04/2016, 12:18 PM  [facility] [address] [place], [place], [address] Phone: [phone]   Fax:  [phone]  Name: Jalaysha Skilton MRN: [id number] Date of Birth: 10/10/1970  PHYSICAL THERAPY DISCHARGE SUMMARY  Visits from Start of Care: 5  Current functional level related to goals / functional outcomes: See above   Remaining deficits: See above   Education / Equipment: HEP  Plan: Patient agrees to discharge.  Patient goals were partially met. Patient is being discharged due to financial reasons.  ?????    Lillette Boxer Chasse  PT   06/10/16       7:57 AM

## 2016-06-08 ENCOUNTER — Ambulatory Visit: Payer: Medicaid Other | Admitting: Physical Therapy

## 2016-06-11 ENCOUNTER — Ambulatory Visit: Payer: Medicaid Other | Admitting: Physical Therapy

## 2016-07-19 ENCOUNTER — Emergency Department (HOSPITAL_COMMUNITY)
Admission: EM | Admit: 2016-07-19 | Discharge: 2016-07-20 | Disposition: A | Discharge status: Home / Self Care | Payer: Medicaid Other | Attending: Emergency Medicine | Admitting: Emergency Medicine

## 2016-07-19 ENCOUNTER — Encounter (HOSPITAL_COMMUNITY): Payer: Self-pay | Admitting: Oncology

## 2016-07-19 DIAGNOSIS — R059 Cough, unspecified: Secondary | ICD-10-CM

## 2016-07-19 DIAGNOSIS — E1140 Type 2 diabetes mellitus with diabetic neuropathy, unspecified: Secondary | ICD-10-CM | POA: Diagnosis not present

## 2016-07-19 DIAGNOSIS — Z7982 Long term (current) use of aspirin: Secondary | ICD-10-CM | POA: Diagnosis not present

## 2016-07-19 DIAGNOSIS — R05 Cough: Secondary | ICD-10-CM | POA: Diagnosis present

## 2016-07-19 DIAGNOSIS — Z794 Long term (current) use of insulin: Secondary | ICD-10-CM | POA: Diagnosis not present

## 2016-07-19 DIAGNOSIS — J029 Acute pharyngitis, unspecified: Secondary | ICD-10-CM

## 2016-07-19 DIAGNOSIS — J069 Acute upper respiratory infection, unspecified: Secondary | ICD-10-CM | POA: Diagnosis not present

## 2016-07-19 DIAGNOSIS — I10 Essential (primary) hypertension: Secondary | ICD-10-CM | POA: Insufficient documentation

## 2016-07-19 DIAGNOSIS — Z96652 Presence of left artificial knee joint: Secondary | ICD-10-CM | POA: Insufficient documentation

## 2016-07-19 DIAGNOSIS — J45909 Unspecified asthma, uncomplicated: Secondary | ICD-10-CM | POA: Diagnosis not present

## 2016-07-19 NOTE — ED Triage Notes (Signed)
Pt arrives via EMS with c/o sob and wheezing. Has had productive cough for several weeks. Hx of COPD. Pt used home nebulizer prior to transport. 94% on room air, hr 80, 138/74.

## 2016-07-19 NOTE — ED Triage Notes (Signed)
Pt c/o flu like sx.  Pt is speaking in full sentences.  States flu like sx started approximately one week ago.

## 2016-07-20 MED ORDER — BENZONATATE 100 MG PO CAPS: 100.0000 mg | ORAL_CAPSULE | Freq: Three times a day (TID) | ORAL | 0 refills | Status: DC

## 2016-07-20 MED ORDER — FLUTICASONE PROPIONATE 50 MCG/ACT NA SUSP: 2.0000 | Freq: Every day | NASAL | 0 refills | Status: DC

## 2016-07-20 MED ORDER — GUAIFENESIN 100 MG/5ML PO LIQD: 100.0000 mg | ORAL | 0 refills | Status: DC | PRN

## 2016-07-20 MED ORDER — HYDROCODONE-HOMATROPINE 5-1.5 MG/5ML PO SYRP: 5.0000 mL | ORAL_SOLUTION | Freq: Four times a day (QID) | ORAL | 0 refills | Status: DC | PRN

## 2016-07-20 NOTE — Discharge Instructions (Signed)
Your symptoms are most likely due to an upper respiratory infection most likely caused by a virus. As we discussed your symptoms may persist for 1-3 weeks. You have been prescribed medications to help with your symptoms. Please take Flonase for your nasal congestion and to decrease postnasal drip, Mucinex for chest congestion and mucous buildup, Hycodan syrup for nighttime cough and chest wall pain due to coughing, Tessalon for daytime cough. Make sure that you get plenty of rest and drink a lot of water to stay hydrated. Please monitor your symptoms and make sure that they improve in the next 1-2 weeks. An viral upper respiratory tract infection has the potential to become a lower respiratory tract infection or bronchitis. If you note is that your symptoms are getting worse in the next 1-2 weeks please return to the emergency department for a reevaluation. Please read the attached information on viral respiratory infection, cough, sore throat.

## 2016-07-20 NOTE — ED Provider Notes (Signed)
[facility]-EMERGENCY DEPT Provider Note   CSN: [id number] Arrival date & time: 07/19/16  1920     History   Chief Complaint Chief Complaint  Patient presents with  . Flu Like Sx    HPI Jennifer [name] is a 46 y.o. female with pertinent pmh of asthma, chronic bronchitis/COPD, seasonal allergies, HTN, hyperlipidemia, obesity, insulin dependent T2DM presents to the emergency department with cough, sore throat, chills, and shortness of breath secondary to cough x 1.5 weeks. Pt states she has tried cough drops, albuterol and Qvar inhalers with minimal relief of symptoms. Pt denies fevers, wheezing, exertional chest pain, exertional shortness of breath, nausea, vomiting, abdominal pain, body aches. Pt cannot remember her last asthma exacerbation, but states it was a long time ago.  Pt states she has never required intubation for asthma complications.    Pt denies h/o recent surgery (had knee replacement surgery 3 months ago), recent prolonged travel, previous DVT/PE, hemoptysis, calf swelling or pain. Pt has never smoked.   HPI  Past Medical History:  Diagnosis Date  . Allergy    Singulair and Allgra daily  . Arthritis   . Asthma    has inhalers and neb treatments  . Chronic bronchitis (HCC)   . Diabetes (HCC)    states she doesn't check at home.Humalog 75/25 and Basaglar daily as well as Januvia  . History of hiatal hernia   . Hyperlipidemia    takes Lipitor daily  . Hypertension    takes Losartan and Metoprolol daily  . Joint pain   . Joint swelling   . Numbness    both hands  . Peripheral edema    takes Furosemide daily  . Peripheral neuropathy (HCC)    takes Lyrica daily    Patient Active Problem List   Diagnosis Date Noted  . S/P knee replacement 04/23/2016  . Essential hypertension, benign 04/23/2016  . Dyslipidemia associated with type 2 diabetes mellitus (HCC) 04/23/2016  . 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 Bilateral  edema of lower extremity 04/23/2016  . Asthma in adult, moderate persistent, uncomplicated 04/23/2016  . Slow transit constipation 04/23/2016  . Primary osteoarthritis of left knee 04/19/2016    Past Surgical History:  Procedure Laterality Date  . ABDOMINAL HYSTERECTOMY  2002  . BLADDER SURGERY  2015   sling. Metal battery implanted  . cyst removed from stomach    . EGD with diliation    . FOOT SURGERY Left 2014  . HEMORRHOID SURGERY  2012  . knee arhroscopy Left 2016  . TOTAL KNEE ARTHROPLASTY Left 04/20/2016   Procedure: TOTAL KNEE ARTHROPLASTY;  Surgeon: Gean Birchwood, MD;  Location: [facility] OR;  Service: Orthopedics;  Laterality: Left;    OB History    No data available       Home Medications    Prior to Admission medications   Medication Sig Start Date End Date Taking? Authorizing Provider  ASMANEX HFA 200 MCG/ACT AERO Inhale 2 puffs into the lungs daily. 03/24/16   Historical Provider, MD  aspirin EC 325 MG tablet Take 1 tablet (325 mg total) by mouth 2 (two) times daily. 04/20/16   Allena Katz, PA-C  atorvastatin (LIPITOR) 20 MG tablet Take 20 mg by mouth at bedtime.     Historical Provider, MD  benzonatate (TESSALON) 100 MG capsule Take 1 capsule (100 mg total) by mouth every 8 (eight) hours. FOR DAY TIME COUGH 07/20/16   Liberty Handy, PA-C  Cholecalciferol (VITAMIN D) 2000  units CAPS Take 2,000 Units by mouth daily.    Historical Provider, MD  docusate sodium (COLACE) 100 MG capsule Take 100 mg by mouth 2 (two) times daily.    Historical Provider, MD  fexofenadine (ALLEGRA) 180 MG tablet Take 180 mg by mouth at bedtime.     Historical Provider, MD  FIBER PO Take 1 capsule by mouth 2 (two) times daily.    Historical Provider, MD  fluticasone (FLONASE) 50 MCG/ACT nasal spray Place 2 sprays into both nostrils daily. 07/20/16   Liberty Handy, PA-C  furosemide (LASIX) 20 MG tablet Take 20 mg by mouth every morning.     Historical Provider, MD  guaiFENesin (ROBITUSSIN)  100 MG/5ML liquid Take 5-10 mLs (100-200 mg total) by mouth every 4 (four) hours as needed for cough. 07/20/16   Liberty Handy, PA-C  HYDROcodone-homatropine (HYCODAN) 5-1.5 MG/5ML syrup Take 5 mLs by mouth every 6 (six) hours as needed for cough. FOR NIGHT TIME COUGH AND CHEST WALL PAIN DUE TO COUGH 07/20/16   Liberty Handy, PA-C  ibuprofen (ADVIL,MOTRIN) 600 MG tablet Take 1 tablet (600 mg total) by mouth every 6 (six) hours as needed. 02/07/16   Lorre Nick, MD  Insulin Glargine [facility]) 100 UNIT/ML SOPN Inject 25 Units into the skin 2 (two) times daily.    Historical Provider, MD  insulin lispro protamine-lispro (HUMALOG 75/25 MIX) (75-25) 100 UNIT/ML SUSP injection Inject 15 Units into the skin 3 (three) times daily.    Historical Provider, MD  losartan (COZAAR) 100 MG tablet Take 50 mg by mouth daily.     Historical Provider, MD  LYRICA 150 MG capsule Take 1 capsule (150 mg total) by mouth 2 (two) times daily. 04/23/16   Sharon Seller, NP  methocarbamol (ROBAXIN) 500 MG tablet Take 1 tablet (500 mg total) by mouth every 6 (six) hours as needed for muscle spasms. 04/20/16   Allena Katz, PA-C  metoprolol (LOPRESSOR) 50 MG tablet Take 50 mg by mouth daily.     Historical Provider, MD  montelukast (SINGULAIR) 10 MG tablet Take 10 mg by mouth at bedtime.    Historical Provider, MD  oxyCODONE-acetaminophen (ROXICET) 5-325 MG tablet Take 1 tablet by mouth every 4 (four) hours as needed. 04/23/16   Sharon Seller, NP  polyethylene glycol (MIRALAX / GLYCOLAX) packet Take 17 g by mouth daily.    Historical Provider, MD  QVAR 80 MCG/ACT inhaler Inhale 1-2 puffs into the lungs 2 (two) times daily. 03/30/16   Historical Provider, MD  sitaGLIPtin (JANUVIA) 100 MG tablet Take 100 mg by mouth daily.    Historical Provider, MD    Family History No family history on file.  Social History Social History  Substance Use Topics  . Smoking status: Never Smoker  . Smokeless tobacco:  Never Used  . Alcohol use No     Allergies   Adhesive [tape]   Review of Systems Review of Systems  Constitutional: Positive for chills. Negative for fever.  HENT: Positive for congestion, postnasal drip, rhinorrhea and sore throat. Negative for sinus pain, sinus pressure and trouble swallowing.   Eyes: Negative for visual disturbance.  Respiratory: Positive for cough and chest tightness. Negative for shortness of breath and wheezing.   Cardiovascular: Negative for chest pain and leg swelling.  Gastrointestinal: Negative for abdominal pain, constipation, diarrhea, nausea and vomiting.  Genitourinary: Negative for difficulty urinating.  Musculoskeletal: Negative for arthralgias and myalgias.  Neurological: Negative for dizziness, weakness, light-headedness and headaches.  Physical Exam Updated Vital Signs BP 151/71 (BP Location: Left Arm)   Pulse 70   Temp 98.5 F (36.9 C) (Oral)   Resp 18   Ht 5\' 2"  (1.575 m)   Wt 94.3 kg   SpO2 97%   BMI 38.04 kg/m   Physical Exam  Constitutional: She is oriented to person, place, and time. She appears well-developed and well-nourished. No distress.  NAD.  Speaking in full sentences.  HENT:  Head: Normocephalic and atraumatic.  Right Ear: External ear normal.  Left Ear: External ear normal.  Mouth/Throat: Oropharynx is clear and moist. No oropharyngeal exudate.  Moist mucous membranes.  Moderate nasal mucosa edema. Erythematous oropharynx without edema or exudates.  Normal tonsils. Non-bulging, pearly gray TMs with cone of light without lesions or erythema. No middle ear erythema or edema.   Eyes: Conjunctivae and EOM are normal. Pupils are equal, round, and reactive to light. No scleral icterus.  Neck: Normal range of motion. Neck supple. No JVD present.  Mildly enlarged submandibular lymph nodes, tender.  No anterior cervical adenopathy.   Cardiovascular: Normal rate, regular rhythm, normal heart sounds and intact distal  pulses.   No murmur heard. Normal Hr. Hypertensive SBP 150s.   Pulmonary/Chest:  No tachypnea. Normal oxygen saturation.  Effort normal, pt speaking in full sentences.  Breath sounds normal without wheezing, rales or rhonchi. No egophony. No chest wall tenderness.  Chest expansion symmetric.   Abdominal: Soft. There is no tenderness.  No CVAT bilaterally. No suprapubic tenderness. Negative Murphy's. Negative McBurney's. Negative Psoas sign.  Musculoskeletal: Normal range of motion. She exhibits no deformity.  Neurological: She is alert and oriented to person, place, and time.  Skin: Skin is warm and dry. Capillary refill takes less than 2 seconds.  Psychiatric: She has a normal mood and affect. Her behavior is normal. Judgment and thought content normal.  Nursing note and vitals reviewed.    ED Treatments / Results  Labs (all labs ordered are listed, but only abnormal results are displayed) Labs Reviewed - No data to display  EKG  EKG Interpretation None       Radiology No results found.  Procedures Procedures (including critical care time)  Medications Ordered in ED Medications - No data to display   Initial Impression / Assessment and Plan / ED Course  I have reviewed the triage vital signs and the nursing notes.  Pertinent labs & imaging results that were available during my care of the patient were reviewed by me and considered in my medical decision making (see chart for details).    46 year old female with pertinent past medical history of asthma and chronic bronchitis presents to the emergency department with upper respiratory infection symptoms 1.5 weeks. Nasal congestion, postnasal drip, rhinorrhea, sore throat, cough and chest tightness are most likely due to a viral upper respiratory infection. On my exam patient is nontoxic appearing, speaking in full sentences. No fever, no tachypnea, no tachycardia, normal oxygen saturations. Lungs are clear to  auscultation bilaterally without wheezing, rales, egophony. I do not think that a chest x-ray is indicated at this time as vital signs are within normal limits, there are no signs of consolidation on chest exam, there is no hypoxia. PERC negative.  I doubt bacterial bronchitis, pneumonia, PE, ACS. I think that symptoms are likely due to acute viral URI that can persist for 1-3 weeks, and can be treated conservatively at this point. Given reassuring physical exam and vital signs within normal limits patient will be  discharged with symptomatic treatment including Flonase, Tessalon, Robitussin, Hycodan and Tessalon. Strict ED return precautions given. Patient is aware that a viral upper respiratory tract infection may precede the onset of acute bronchitis, pneumonia. Patient is aware of red flag symptoms to monitor for that would warrant return to the emergency department for further reevaluation. Pt ambulated with pulse ox within normal limits prior to discharge.   Final Clinical Impressions(s) / ED Diagnoses   Final diagnoses:  Viral upper respiratory illness  Sore throat  Cough    New Prescriptions Discharge Medication List as of 07/20/2016  1:14 AM    START taking these medications   Details  guaiFENesin (ROBITUSSIN) 100 MG/5ML liquid Take 5-10 mLs (100-200 mg total) by mouth every 4 (four) hours as needed for cough., Starting Mon 07/20/2016, Print    HYDROcodone-homatropine (HYCODAN) 5-1.5 MG/5ML syrup Take 5 mLs by mouth every 6 (six) hours as needed for cough. FOR NIGHT TIME COUGH AND CHEST WALL PAIN DUE TO COUGH, Starting Mon 07/20/2016, Print    benzonatate (TESSALON) 100 MG capsule Take 1 capsule (100 mg total) by mouth every 8 (eight) hours. FOR DAY TIME COUGH, Starting Mon 07/20/2016, Print    fluticasone (FLONASE) 50 MCG/ACT nasal spray Place 2 sprays into both nostrils daily., Starting Mon 07/20/2016, Print         Liberty HandyClaudia J Darrious Youman, PA-C 07/20/16 0123    Lavera Guiseana Duo Liu,  MD 07/21/16 [phone]

## 2016-08-19 ENCOUNTER — Encounter: Payer: Self-pay | Admitting: Gastroenterology

## 2016-08-19 ENCOUNTER — Ambulatory Visit (INDEPENDENT_AMBULATORY_CARE_PROVIDER_SITE_OTHER): Payer: Medicaid Other | Admitting: Gastroenterology

## 2016-08-19 ENCOUNTER — Encounter (INDEPENDENT_AMBULATORY_CARE_PROVIDER_SITE_OTHER): Payer: Self-pay

## 2016-08-19 VITALS — BP 120/78 | HR 68 | Ht 62.0 in | Wt 207.0 lb

## 2016-08-19 DIAGNOSIS — R1314 Dysphagia, pharyngoesophageal phase: Secondary | ICD-10-CM

## 2016-08-19 DIAGNOSIS — K219 Gastro-esophageal reflux disease without esophagitis: Secondary | ICD-10-CM

## 2016-08-19 MED ORDER — OMEPRAZOLE 40 MG PO CPDR: 40.0000 mg | DELAYED_RELEASE_CAPSULE | Freq: Every day | ORAL | 11 refills | Status: AC

## 2016-08-19 NOTE — Progress Notes (Signed)
History of Present Illness: This is a 46 year old female referred by Jennifer Ducking, MD for the evaluation of dysphagia. Patient relates a history of GERD for the past several years and she relates 2 endoscopies performed in [place] in November 2015 and April 2016. First one seems to be a diagnostic endoscopy and the second one an endoscopy with dilation. Unfortunately we do not have records from those procedures. She states over the past few months she has had worsening solid food dysphagia. Reflux symptoms have been present for several months. She is not currently on any antireflux medications. Denies weight loss, abdominal pain, constipation, diarrhea, change in stool caliber, melena, hematochezia, nausea, vomiting, chest pain.     Allergies  Allergen Reactions  . Adhesive [Tape] Itching and Rash   Outpatient Medications Prior to Visit  Medication Sig Dispense Refill  . ASMANEX HFA 200 MCG/ACT AERO Inhale 2 puffs into the lungs daily.  3  . aspirin EC 325 MG tablet Take 1 tablet (325 mg total) by mouth 2 (two) times daily. 30 tablet 0  . atorvastatin (LIPITOR) 20 MG tablet Take 20 mg by mouth at bedtime.     . benzonatate (TESSALON) 100 MG capsule Take 1 capsule (100 mg total) by mouth every 8 (eight) hours. FOR DAY TIME COUGH 21 capsule 0  . Cholecalciferol (VITAMIN D) 2000 units CAPS Take 2,000 Units by mouth daily.    Marland Kitchen docusate sodium (COLACE) 100 MG capsule Take 100 mg by mouth 2 (two) times daily.    . fexofenadine (ALLEGRA) 180 MG tablet Take 180 mg by mouth at bedtime.     Marland Kitchen FIBER PO Take 1 capsule by mouth 2 (two) times daily.    . fluticasone (FLONASE) 50 MCG/ACT nasal spray Place 2 sprays into both nostrils daily. 16 g 0  . furosemide (LASIX) 20 MG tablet Take 20 mg by mouth every morning.     Marland Kitchen guaiFENesin (ROBITUSSIN) 100 MG/5ML liquid Take 5-10 mLs (100-200 mg total) by mouth every 4 (four) hours as needed for cough. 60 mL 0  . HYDROcodone-homatropine (HYCODAN) 5-1.5  MG/5ML syrup Take 5 mLs by mouth every 6 (six) hours as needed for cough. FOR NIGHT TIME COUGH AND CHEST WALL PAIN DUE TO COUGH 120 mL 0  . ibuprofen (ADVIL,MOTRIN) 600 MG tablet Take 1 tablet (600 mg total) by mouth every 6 (six) hours as needed. 30 tablet 0  . losartan (COZAAR) 100 MG tablet Take 50 mg by mouth daily.     Marland Kitchen LYRICA 150 MG capsule Take 1 capsule (150 mg total) by mouth 2 (two) times daily. 60 capsule 0  . methocarbamol (ROBAXIN) 500 MG tablet Take 1 tablet (500 mg total) by mouth every 6 (six) hours as needed for muscle spasms. 60 tablet 0  . metoprolol (LOPRESSOR) 50 MG tablet Take 50 mg by mouth daily.     . montelukast (SINGULAIR) 10 MG tablet Take 10 mg by mouth at bedtime.    Marland Kitchen oxyCODONE-acetaminophen (ROXICET) 5-325 MG tablet Take 1 tablet by mouth every 4 (four) hours as needed. 180 tablet 0  . polyethylene glycol (MIRALAX / GLYCOLAX) packet Take 17 g by mouth daily.    Marland Kitchen QVAR 80 MCG/ACT inhaler Inhale 1-2 puffs into the lungs 2 (two) times daily.  5  . Insulin Glargine (BASAGLAR KWIKPEN) 100 UNIT/ML SOPN Inject 25 Units into the skin 2 (two) times daily.    . insulin lispro protamine-lispro (HUMALOG 75/25 MIX) (75-25) 100 UNIT/ML SUSP  injection Inject 15 Units into the skin 3 (three) times daily.    . sitaGLIPtin (JANUVIA) 100 MG tablet Take 100 mg by mouth daily.     No facility-administered medications prior to visit.    Past Medical History:  Diagnosis Date  . Allergy    Singulair and Allgra daily  . Arthritis   . Asthma    has inhalers and neb treatments  . Chronic bronchitis (HCC)   . Diabetes (HCC)    states she doesn't check at home.Humalog 75/25 and Basaglar daily as well as Januvia  . History of hiatal hernia   . Hyperlipidemia    takes Lipitor daily  . Hypertension    takes Losartan and Metoprolol daily  . Joint pain   . Joint swelling   . Numbness    both hands  . Peripheral edema    takes Furosemide daily  . Peripheral neuropathy (HCC)     takes Lyrica daily   Past Surgical History:  Procedure Laterality Date  . ABDOMINAL HYSTERECTOMY  2002  . BLADDER SURGERY  2015   sling. Metal battery implanted  . cyst removed from stomach    . EGD with diliation    . FOOT SURGERY Left 2014  . HEMORRHOID SURGERY  2012  . knee arhroscopy Left 2016  . TOTAL KNEE ARTHROPLASTY Left 04/20/2016   Procedure: TOTAL KNEE ARTHROPLASTY;  Surgeon: Gean Birchwood, MD;  Location: [facility] OR;  Service: Orthopedics;  Laterality: Left;   Social History   Social History  . Marital status: Single    Spouse name: [name]  . Number of children: 2  . Years of education: [name]   Occupational History  . Disabled    Social History Main Topics  . Smoking status: Never Smoker  . Smokeless tobacco: Never Used  . Alcohol use No  . Drug use: No  . Sexual activity: Not Asked   Other Topics Concern  . None   Social History Narrative  . None   Family History  Problem Relation Age of Onset  . Diabetes Mother   . Diabetes Father   . Diabetes Sister       Review of Systems: Pertinent positive and negative review of systems were noted in the above HPI section. All other review of systems were otherwise negative.   Physical Exam: General: Well developed, well nourished, no acute distress Head: Normocephalic and atraumatic Eyes:  sclerae anicteric, EOMI Ears: Normal auditory acuity Mouth: No deformity or lesions Neck: Supple, no masses or thyromegaly Lungs: Clear throughout to auscultation Heart: Regular rate and rhythm; no murmurs, rubs or bruits Abdomen: Soft, non tender and non distended. No masses, hepatosplenomegaly or hernias noted. Normal Bowel sounds Musculoskeletal: Symmetrical with no gross deformities  Skin: No lesions on visible extremities Pulses:  Normal pulses noted Extremities: No clubbing, cyanosis, edema or deformities noted Neurological: Alert oriented x 4, grossly nonfocal Cervical Nodes:  No significant cervical adenopathy Inguinal  Nodes: No significant inguinal adenopathy Psychological:  Alert and cooperative. Normal mood and affect  Assessment and Recommendations:  1. Dysphagia and GERD. Suspected esophageal stricture. Possible motility disorder. Request records from prior GI evaluation in [place]. follow all standard antireflux measures begin omeprazole 40 mg daily long-term. Schedule barium esophagram and an EGD. The risks (including bleeding, perforation, infection, missed lesions, medication reactions and possible hospitalization or surgery if complications occur), benefits, and alternatives to endoscopy with possible biopsy and possible dilation were discussed with the patient and they consent to proceed.  cc: Jennifer Duckingichard Pavelock, MD

## 2016-08-19 NOTE — Patient Instructions (Signed)
We have sent the following medications to your pharmacy for you to pick up at your convenience: omeprazole.   You have been [facility]1st floor of the hospital) on 08/28/16 at 1:30pm. Please arrive 15 minutes prior to your appointment for registration. Make certain not to have anything to eat or drink 6 hours prior to your test. If you need to reschedule for any reason, please contact radiology at [phone] to do so. __________________________________________________________________ A barium swallow is an examination that concentrates on views of the esophagus. This tends to be a double contrast exam (barium and two liquids which, when combined, create a gas to distend the wall of the oesophagus) or single contrast (non-ionic iodine based). The study is usually tailored to your symptoms so a good history is essential. Attention is paid during the study to the form, structure and configuration of the esophagus, looking for functional disorders (such as aspiration, dysphagia, achalasia, motility and reflux) EXAMINATION You may be asked to change into a gown, depending on the type of swallow being performed. A radiologist and radiographer will perform the procedure. The radiologist will advise you of the type of contrast selected for your procedure and direct you during the exam. You will be asked to stand, sit or lie in several different positions and to hold a small amount of fluid in your mouth before being asked to swallow while the imaging is performed .In some instances you may be asked to swallow barium coated marshmallows to assess the motility of a solid food bolus. The exam can be recorded as a digital or video fluoroscopy procedure. POST PROCEDURE It will take 1-2 days for the barium to pass through your system. To facilitate this, it is important, unless otherwise directed, to increase your fluids for the next 24-48hrs and to resume your normal  diet.  This test typically takes about 30 minutes to perform. _________________________________________________________________________  Jennifer [name] have been scheduled for an endoscopy. Please follow written instructions given to you at your visit today. If you use inhalers (even only as needed), please bring them with you on the day of your procedure. Your physician has requested that you go to www.startemmi.com and enter the access code given to you at your visit today. This web site gives a general overview about your procedure. However, you should still follow specific instructions given to you by our office regarding your preparation for the procedure.  Normal BMI (Body Mass Index- based on height and weight) is between 19 and 25. Your BMI today is Body mass index is 37.86 kg/m. Marland Kitchen. Please consider follow up  regarding your BMI with your Primary Care Provider.  Thank you for choosing me and [facility].  Jennifer [name] [name], [name], Jennifer [name], Jennifer GrahamFACG

## 2016-08-27 ENCOUNTER — Telehealth: Payer: Self-pay | Admitting: Gastroenterology

## 2016-08-27 NOTE — Telephone Encounter (Signed)
Patient notified that she needs to have BS tomorrow and will then evaluate if procedure needs to be moved. UP.  She is advised to avoid steak, chicken, and bread and that she needs to eat very small bites and chew very well.  I did check for earlier procedure times and none available at this time.

## 2016-08-28 ENCOUNTER — Ambulatory Visit (HOSPITAL_COMMUNITY)
Admission: RE | Admit: 2016-08-28 | Discharge: 2016-08-28 | Disposition: A | Discharge status: Home / Self Care | Payer: Medicaid Other | Source: Ambulatory Visit | Attending: Gastroenterology | Admitting: Gastroenterology

## 2016-08-28 DIAGNOSIS — K219 Gastro-esophageal reflux disease without esophagitis: Secondary | ICD-10-CM | POA: Diagnosis not present

## 2016-08-28 DIAGNOSIS — R1314 Dysphagia, pharyngoesophageal phase: Secondary | ICD-10-CM | POA: Diagnosis not present

## 2016-08-28 DIAGNOSIS — K449 Diaphragmatic hernia without obstruction or gangrene: Secondary | ICD-10-CM | POA: Insufficient documentation

## 2016-09-02 ENCOUNTER — Telehealth: Payer: Self-pay | Admitting: Gastroenterology
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
## 2016-09-07 ENCOUNTER — Encounter: Payer: Self-pay | Admitting: Gastroenterology

## 2016-09-07 ENCOUNTER — Ambulatory Visit (AMBULATORY_SURGERY_CENTER): Payer: Medicaid Other | Admitting: Gastroenterology

## 2016-09-07 VITALS — BP 128/73 | HR 90 | Temp 99.8°F | Resp 16 | Ht 62.0 in | Wt 207.0 lb

## 2016-09-07 DIAGNOSIS — K219 Gastro-esophageal reflux disease without esophagitis: Secondary | ICD-10-CM | POA: Diagnosis not present

## 2016-09-07 DIAGNOSIS — R1314 Dysphagia, pharyngoesophageal phase: Secondary | ICD-10-CM | POA: Diagnosis not present

## 2016-09-07 MED ORDER — SODIUM CHLORIDE 0.9 % IV SOLN: 500.0000 mL | INTRAVENOUS | Status: AC

## 2016-09-07 NOTE — Progress Notes (Signed)
Pt's states no medical or surgical changes since previsit or office visit. 

## 2016-09-07 NOTE — Patient Instructions (Signed)
Handout given on Post dilation diet   YOU HAD AN ENDOSCOPIC PROCEDURE TODAY: Refer to the procedure report and other information in the discharge instructions given to you for any specific questions about what was found during the examination. If this information does not answer your questions, please call [place] office at [phone] to clarify.   YOU SHOULD EXPECT: Some feelings of bloating in the abdomen. Passage of more gas than usual. Walking can help get rid of the air that was put into your GI tract during the procedure and reduce the bloating. If you had a lower endoscopy (such as a colonoscopy or flexible sigmoidoscopy) you may notice spotting of blood in your stool or on the toilet paper. Some abdominal soreness may be present for a day or two, also.  DIET: Your first meal following the procedure should be a light meal and then it is ok to progress to your normal diet. A half-sandwich or bowl of soup is an example of a good first meal. Heavy or fried foods are harder to digest and may make you feel nauseous or bloated. Drink plenty of fluids but you should avoid alcoholic beverages for 24 hours. If you had a esophageal dilation, please see attached instructions for diet.    ACTIVITY: Your care partner should take you home directly after the procedure. You should plan to take it easy, moving slowly for the rest of the day. You can resume normal activity the day after the procedure however YOU SHOULD NOT DRIVE, use power tools, machinery or perform tasks that involve climbing or major physical exertion for 24 hours (because of the sedation medicines used during the test).   SYMPTOMS TO REPORT IMMEDIATELY: A gastroenterologist can be reached at any hour. Please call [phone]  for any of the following symptoms:   Following upper endoscopy (EGD, EUS, ERCP, esophageal dilation) Vomiting of blood or coffee ground material  New, significant abdominal pain  New, significant chest pain or pain  under the shoulder blades  Painful or persistently difficult swallowing  New shortness of breath  Black, tarry-looking or red, bloody stools  FOLLOW UP:  If any biopsies were taken you will be contacted by phone or by letter within the next 1-3 weeks. Call [phone]  if you have not heard about the biopsies in 3 weeks.  Please also call with any specific questions about appointments or follow up tests.

## 2016-09-07 NOTE — Progress Notes (Signed)
Called to room to assist during endoscopic procedure.  Patient ID and intended procedure confirmed with present staff. Received instructions for my participation in the procedure from the performing physician.  

## 2016-09-07 NOTE — Progress Notes (Signed)
Report given to PACU, vss 

## 2016-09-07 NOTE — Op Note (Signed)
[facility] Patient Name: Jennifer [name] Procedure Date: 09/07/2016 1:39 PM MRN: [id number] Endoscopist: Meryl Dare , MD Age: 46 Referring MD:  Date of Birth: Nov 02, 1970 Gender: Female Account #: [id number] Procedure:                Upper GI endoscopy Indications:              Dysphagia, Gastroesophageal reflux disease Medicines:                Monitored Anesthesia Care Procedure:                Pre-Anesthesia Assessment:                           - Prior to the procedure, a History and Physical                            was performed, and patient medications and                            allergies were reviewed. The patient's tolerance of                            previous anesthesia was also reviewed. The risks                            and benefits of the procedure and the sedation                            options and risks were discussed with the patient.                            All questions were answered, and informed consent                            was obtained. Prior Anticoagulants: The patient has                            taken no previous anticoagulant or antiplatelet                            agents. ASA Grade Assessment: III - A patient with                            severe systemic disease. After reviewing the risks                            and benefits, the patient was deemed in                            satisfactory condition to undergo the procedure.                           After obtaining informed consent, the endoscope was  passed under direct vision. Throughout the                            procedure, the patient's blood pressure, pulse, and                            oxygen saturations were monitored continuously. The                            Endoscope was introduced through the mouth, and                            advanced to the second part of duodenum. The upper                            GI  endoscopy was accomplished without difficulty.                            The patient tolerated the procedure well. Scope In: Scope Out: Findings:                 No endoscopic abnormality was evident in the                            esophagus to explain the patient's complaint of                            dysphagia. It was decided, however, to proceed with                            dilation of the entire esophagus given her                            dysphagia. A guidewire was placed and the scope was                            withdrawn. Dilation was performed with a Savary                            dilator with no resistance at 16 mm. Estimated                            blood loss: none.                           A small hiatal hernia was present.                           The exam of the stomach was otherwise normal.                           The duodenal bulb and second portion of the  duodenum were normal. Complications:            No immediate complications. Estimated Blood Loss:     Estimated blood loss: none. Impression:               - No endoscopic esophageal abnormality to explain                            patient's dysphagia. Esophagus dilated. Dilated.                           - Small hiatal hernia.                           - Normal duodenal bulb and second portion of the                            duodenum.                           - No specimens collected. Recommendation:           - Patient has a contact number available for                            emergencies. The signs and symptoms of potential                            delayed complications were discussed with the                            patient. Return to normal activities tomorrow.                            Written discharge instructions were provided to the                            patient.                           - Clear liquid diet for 2 hours, then advance as                             tolerated to soft diet today. Resume prior diet                            tomorrow.                           - Antireflux measures long term.                           - Continue present medications. Meryl DareMalcolm T Shamell Suarez, MD 09/07/2016 1:55:07 PM This report has been signed electronically.

## 2016-09-08 ENCOUNTER — Telehealth: Payer: Self-pay

## 2016-09-08 NOTE — Telephone Encounter (Signed)
  Follow up Call-  Call back number 09/07/2016  Post procedure Call Back phone  # [phone]  Permission to leave phone message Yes     Patient questions:  Do you have a fever, pain , or abdominal swelling? No. Pain Score  0 *  Have you tolerated food without any problems? Yes.    Have you been able to return to your normal activities? Yes.    Do you have any questions about your discharge instructions: Diet   No. Medications  No. Follow up visit  No.  Do you have questions or concerns about your Care? No.  Actions: * If pain score is 4 or above: No action needed, pain <4.

## 2016-10-23 ENCOUNTER — Encounter: Payer: Medicaid Other | Admitting: Gastroenterology

## 2016-11-19 ENCOUNTER — Telehealth: Payer: Self-pay | Admitting: *Deleted

## 2016-11-30 ENCOUNTER — Encounter: Payer: Self-pay | Admitting: Internal Medicine

## 2016-11-30 ENCOUNTER — Ambulatory Visit (INDEPENDENT_AMBULATORY_CARE_PROVIDER_SITE_OTHER): Payer: Medicaid Other | Admitting: Internal Medicine

## 2016-11-30 VITALS — BP 136/72 | HR 83 | Temp 97.8°F | Ht 62.0 in | Wt 207.5 lb

## 2016-11-30 DIAGNOSIS — M545 Low back pain: Secondary | ICD-10-CM

## 2016-11-30 DIAGNOSIS — Z96652 Presence of left artificial knee joint: Secondary | ICD-10-CM | POA: Diagnosis not present

## 2016-11-30 DIAGNOSIS — I10 Essential (primary) hypertension: Secondary | ICD-10-CM | POA: Diagnosis not present

## 2016-11-30 DIAGNOSIS — M549 Dorsalgia, unspecified: Secondary | ICD-10-CM | POA: Insufficient documentation

## 2016-11-30 DIAGNOSIS — E1169 Type 2 diabetes mellitus with other specified complication: Secondary | ICD-10-CM

## 2016-11-30 DIAGNOSIS — J4540 Moderate persistent asthma, uncomplicated: Secondary | ICD-10-CM

## 2016-11-30 DIAGNOSIS — E784 Other hyperlipidemia: Secondary | ICD-10-CM

## 2016-11-30 DIAGNOSIS — M1712 Unilateral primary osteoarthritis, left knee: Secondary | ICD-10-CM | POA: Diagnosis not present

## 2016-11-30 DIAGNOSIS — Z794 Long term (current) use of insulin: Secondary | ICD-10-CM

## 2016-11-30 DIAGNOSIS — E785 Hyperlipidemia, unspecified: Secondary | ICD-10-CM

## 2016-11-30 DIAGNOSIS — K5901 Slow transit constipation: Secondary | ICD-10-CM

## 2016-11-30 DIAGNOSIS — E1143 Type 2 diabetes mellitus with diabetic autonomic (poly)neuropathy: Secondary | ICD-10-CM | POA: Diagnosis not present

## 2016-11-30 DIAGNOSIS — Z0189 Encounter for other specified special examinations: Secondary | ICD-10-CM | POA: Diagnosis present

## 2016-11-30 DIAGNOSIS — Z833 Family history of diabetes mellitus: Secondary | ICD-10-CM | POA: Diagnosis not present

## 2016-11-30 DIAGNOSIS — Z8249 Family history of ischemic heart disease and other diseases of the circulatory system: Secondary | ICD-10-CM | POA: Diagnosis not present

## 2016-11-30 LAB — POCT GLYCOSYLATED HEMOGLOBIN (HGB A1C): HEMOGLOBIN A1C: 10.1

## 2016-11-30 LAB — GLUCOSE, CAPILLARY: GLUCOSE-CAPILLARY: 175 mg/dL — AB (ref 65–99)

## 2016-11-30 MED ORDER — INSULIN GLARGINE 100 UNIT/ML ~~LOC~~ SOLN: SUBCUTANEOUS | 3 refills | Status: DC

## 2016-11-30 MED ORDER — INSULIN STARTER KIT- SYRINGES (ENGLISH): 1.0000 | Freq: Once | Status: DC

## 2016-11-30 MED ORDER — INSULIN GLARGINE 100 UNIT/ML SOLOSTAR PEN: 25.0000 [IU] | PEN_INJECTOR | Freq: Every day | SUBCUTANEOUS | 11 refills | Status: AC

## 2016-11-30 MED ORDER — ATORVASTATIN CALCIUM 20 MG PO TABS: 20.0000 mg | ORAL_TABLET | Freq: Every day | ORAL | 11 refills | Status: AC

## 2016-11-30 MED ORDER — SITAGLIPTIN PHOSPHATE 25 MG PO TABS: 25.0000 mg | ORAL_TABLET | Freq: Every day | ORAL | 2 refills | Status: AC

## 2016-11-30 MED ORDER — INSULIN PEN NEEDLE 32G X 6 MM MISC: 1.0000 | Freq: Every morning | 3 refills | Status: AC

## 2016-11-30 NOTE — Assessment & Plan Note (Signed)
According to patient she stopped taking her Lipitor, as she do not like taking medications. I tried explaining her that it will help her prevent a heart attack and stroke as she is high risk because of her diabetes. Apparently patient said she will restart taking it.  No lipid profile on record for the past one year. -Check lipid panel.

## 2016-11-30 NOTE — Assessment & Plan Note (Signed)
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 She is able to walk without any difficulty, although stating that she had a 7 days a week aide coming to her home to help her with basic housekeeping.  She was having normal range of motion and no obvious deformity.  I encouraged her to exercise more.

## 2016-11-30 NOTE — Assessment & Plan Note (Signed)
According to patient she has a diagnosis of COPD, I cannot found any record, she is a nonsmoker. She does not has an history of prolonged episodes of coughing or wheezing. Her symptoms looks more like due to seasonal allergies causing mild congestion. According to patient she used Singulair and QVAR inhalers.  Her chest was clear on exam. No pulmonary function testing on record.  We will obtain records from her PCP. She can continue Singulair and Qvar inhalers. She might need pulmonary function testing if not done previously.

## 2016-11-30 NOTE — Assessment & Plan Note (Signed)
She was also complaining of nonspecific lower back pain, looks more like musculoskeletal. There was no relationship with any activity. She sits most of the time.  There was no tenderness or restriction of range of motion. Back ache was more in paraspinal area.  I discontinue her Mobic, as patient's wants to decrease her pill load. I advised her to use Tylenol or ibuprofen if needed. I advised her to do lower back exercises regularly.

## 2016-11-30 NOTE — Assessment & Plan Note (Signed)
She had a bowel movement 3-4 times per week, according to patient it was normal. She does not drink enough water.  I encouraged her to increased water intake. I also advised to increase fiber intake.

## 2016-11-30 NOTE — Progress Notes (Addendum)
CC: To establish care in our clinic.  HPI:  Jennifer [name] is a 46 y.o. with past medical history significant for arthritis s/p left knee, total arthroplasty, diabetes-off of Lantus and Januvia for 6 month, hypertension-off the losartan now, moved from [place] in July 2017 because of some residential issues, came to our clinic to establish care.  Hypertension. According to the records patient was on losartan 100 mg daily she was placed on that in [place],Her current PCP Dr. Jeri LagerPavlock took her off from that medication. She has no headaches, who complained of some blurry vision because of poor eyesight, according to patient she needs a new glasses and has an upcoming appointment with her eye doctor in 12/08/2016.  Diabetes. According to patient she was diagnosed with diabetes around the age of [age], she was on Lantus and Januvia while in [place], her last A1c recorded in system was in October 2017 which was 5.7, her PCP asked her to stop taking her Lantus and Januvia. patient also told me that her last A1c done last month   at her PCP office was above 10, but her doctor wants her to bring her A1c down with diet and no medications.  Arthritis. 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 She is able to walk without any difficulty, although stating that she had a 7 days a week aide coming to her home to help her with basic housekeeping.  Back pain. she was also complaining of nonspecific lower back pain, looks more like musculoskeletal.  COPD/asthma/chronic bronchitis. According to patient she has a diagnosis of COPD, I cannot found any record, she is a nonsmoker. She does not has an history of prolonged episodes of coughing or wheezing. Her symptoms looks more like due to seasonal allergies causing mild congestion. According  to patient she used Singulair and QVAR inhalers.  Burning micturition. She has very nonspecific complaint of burning micturition for one day, denies any lower abdominal pain, fever or chills. According to patient she had a couple of bladder surgeries due to increased frequency of urine when she was in [place]  Referral for bariatric surgery.she was also asking for a referral to bariatric surgery to lose weight,according to her she cannot exercise and control her diet.  Social history. Lives alone, recently moved from [place] in July 2017 because of some housing issues. She had to wait her kids living in [place], no family or friends in [place]   she is a nonsmoker, denied any alcohol or illicit drug use.  Family history. Multiple family members including parents and sibling with diabetes and hypertension.  Addendum. Her blood workup and UA came back within normal limit, lipid profile shows elevated total cholesterol and LDH. Her ASCVD risk score came back to 6.5%, which can be decreased to 0.7% with optimal risk management. She is already on Lipitor 20 mg daily. She was advised to continue her Lipitor, and continued to have lifestyle modifications. Patient is resistant to do lifestyle modifications at this time.  Past Medical History:  Diagnosis Date  . Allergy    Singulair and Allgra daily  . Arthritis   . Asthma    has inhalers and neb treatments  . Chronic bronchitis (HCC)   . Diabetes (HCC)    states she doesn't check at home.Humalog 75/25 and Basaglar daily  as well as Januvia  . History of hiatal hernia   . Hyperlipidemia    takes Lipitor daily  . Hypertension    takes Losartan and Metoprolol daily  . Joint pain   . Joint swelling   . Numbness    both hands  . Peripheral edema    takes Furosemide daily  . Peripheral neuropathy    takes Lyrica daily    Review of Systems:  As per HPI.  Physical Exam:  Vitals:   11/30/16 0916  BP: 136/72  Pulse: 83    Temp: 97.8 F (36.6 C)  TempSrc: Oral  SpO2: 100%  Weight: 207 lb 8 oz (94.1 kg)  Height: 5\' 2"  (1.575 m)    General: Vital signs reviewed.  Patient is well-developed and well-nourished, in no acute distress and cooperative with exam.  Head: Normocephalic and atraumatic. Eyes: EOMI, conjunctivae normal, no scleral icterus.  Neck: Supple, trachea midline, normal ROM, no JVD, masses, thyromegaly, or carotid bruit present.  Cardiovascular: RRR, S1 normal, S2 normal, no murmurs, gallops, or rubs. Pulmonary/Chest: Clear to auscultation bilaterally, no wheezes, rales, or rhonchi. Abdominal: Soft, non-tender, non-distended, BS +, no masses, organomegaly, or guarding present.  Musculoskeletal: No joint deformities, erythema, or stiffness, ROM full and nontender. Extremities:Trace left sided pedal edema,pulses symmetric and intact bilaterally. No cyanosis or clubbing. Neurological: A&O x3, Strength is normal and symmetric bilaterally, cranial nerve II-XII are grossly intact, no focal motor deficit, sensory intact to light touch bilaterally.  Skin: Warm, dry and intact. Few signs of mosquito bite with excoriation marks.  Psychiatric: There is resistance to all the advice.  Assessment & Plan:   See Encounters Tab for problem based charting.  Patient discussed with Dr. Heide Spark.

## 2016-11-30 NOTE — Addendum Note (Signed)
Addended by: Arnetha CourserAMIN, Tawnee Clegg on: 11/30/2016 01:07 PM   Modules accepted: Level of Service

## 2016-11-30 NOTE — Assessment & Plan Note (Signed)
BP Readings from Last 3 Encounters:  11/30/16 136/72  09/07/16 128/73  08/19/16 120/78   According to patient she was on losartan 100 mg daily, her PCP took her off because of normal blood pressure.  She is normotensive.  Keep monitoring her blood pressure-there is no need to restart her on antihypertensives at this time.

## 2016-11-30 NOTE — Patient Instructions (Addendum)
Thank you for visiting clinic today. We will call you with any abnormal lab results. You can try doing some back exercises to strengthen your back muscles. As we have discussed your A1c is very high, I will  restart your previous diabetic medications of Lantus 25 units in the morning and low-dose Januvia 25 mg daily. We will see you back in 4 weeks. According to your request-I am discontinuing your Mobic and Lyrica. Please decrease her Lyrica dose slowly and then stopped within 1 week. You can always restart it if your tingling and numbness restarts.     Back Exercises If you have pain in your back, do these exercises 2-3 times each day or as told by your doctor. When the pain goes away, do the exercises once each day, but repeat the steps more times for each exercise (do more repetitions). If you do not have pain in your back, do these exercises once each day or as told by your doctor. Exercises Single Knee to Chest  Do these steps 3-5 times in a row for each leg: 1. Lie on your back on a firm bed or the floor with your legs stretched out. 2. Bring one knee to your chest. 3. Hold your knee to your chest by grabbing your knee or thigh. 4. Pull on your knee until you feel a gentle stretch in your lower back. 5. Keep doing the stretch for 10-30 seconds. 6. Slowly let go of your leg and straighten it.  Pelvic Tilt  Do these steps 5-10 times in a row: 1. Lie on your back on a firm bed or the floor with your legs stretched out. 2. Bend your knees so they point up to the ceiling. Your feet should be flat on the floor. 3. Tighten your lower belly (abdomen) muscles to press your lower back against the floor. This will make your tailbone point up to the ceiling instead of pointing down to your feet or the floor. 4. Stay in this position for 5-10 seconds while you gently tighten your muscles and breathe evenly.  Cat-Cow  Do these steps until your lower back bends more easily: 1. Get on your  hands and knees on a firm surface. Keep your hands under your shoulders, and keep your knees under your hips. You may put padding under your knees. 2. Let your head hang down, and make your tailbone point down to the floor so your lower back is round like the back of a cat. 3. Stay in this position for 5 seconds. 4. Slowly lift your head and make your tailbone point up to the ceiling so your back hangs low (sags) like the back of a cow. 5. Stay in this position for 5 seconds.  Press-Ups  Do these steps 5-10 times in a row: 1. Lie on your belly (face-down) on the floor. 2. Place your hands near your head, about shoulder-width apart. 3. While you keep your back relaxed and keep your hips on the floor, slowly straighten your arms to raise the top half of your body and lift your shoulders. Do not use your back muscles. To make yourself more comfortable, you may change where you place your hands. 4. Stay in this position for 5 seconds. 5. Slowly return to lying flat on the floor.  Bridges  Do these steps 10 times in a row: 1. Lie on your back on a firm surface. 2. Bend your knees so they point up to the ceiling. Your feet should be  flat on the floor. 3. Tighten your butt muscles and lift your butt off of the floor until your waist is almost as high as your knees. If you do not feel the muscles working in your butt and the back of your thighs, slide your feet 1-2 inches farther away from your butt. 4. Stay in this position for 3-5 seconds. 5. Slowly lower your butt to the floor, and let your butt muscles relax.  If this exercise is too easy, try doing it with your arms crossed over your chest. Belly Crunches  Do these steps 5-10 times in a row: 1. Lie on your back on a firm bed or the floor with your legs stretched out. 2. Bend your knees so they point up to the ceiling. Your feet should be flat on the floor. 3. Cross your arms over your chest. 4. Tip your chin a little bit toward your chest  but do not bend your neck. 5. Tighten your belly muscles and slowly raise your chest just enough to lift your shoulder blades a tiny bit off of the floor. 6. Slowly lower your chest and your head to the floor.  Back Lifts Do these steps 5-10 times in a row: 1. Lie on your belly (face-down) with your arms at your sides, and rest your forehead on the floor. 2. Tighten the muscles in your legs and your butt. 3. Slowly lift your chest off of the floor while you keep your hips on the floor. Keep the back of your head in line with the curve in your back. Look at the floor while you do this. 4. Stay in this position for 3-5 seconds. 5. Slowly lower your chest and your face to the floor.  Contact a doctor if:  Your back pain gets a lot worse when you do an exercise.  Your back pain does not lessen 2 hours after you exercise. If you have any of these problems, stop doing the exercises. Do not do them again unless your doctor says it is okay. Get help right away if:  You have sudden, very bad back pain. If this happens, stop doing the exercises. Do not do them again unless your doctor says it is okay. This information is not intended to replace advice given to you by your health care provider. Make sure you discuss any questions you have with your health care provider. Document Released: 07/18/2010 Document Revised: 11/21/2015 Document Reviewed: 08/09/2014 Elsevier Interactive Patient Education  [employer].

## 2016-11-30 NOTE — Assessment & Plan Note (Addendum)
According to patient she was diagnosed with diabetes around the age of 46, she was on Lantus and Januvia while in [place], her last A1c recorded in system was in October 2017 which was 5.7, her PCP asked her to stop taking her Lantus and Januvia. patient also told me that her last A1c done last month   at her PCP office was above 10, but her doctor wants her to bring her A1c down with diet and no medications.  On recheck her A1c was 10.1 today. She told me that her pharmacist ask her to restart her Lantus at 25 units daily according to her previous dosing, she was willing to restart Lantus at 25 unit. She was also on Januvia in the past-I advised her to restart Januvia at a lower dose of 25 and we can titrate up her dose, she was very resistant to restart Januvia, she just want to control her diabetes with Lantus, she was refusing frequent blood sugar checks, any other medications or more insulin.  I gave her a prescription for Lantus and Januvia. Follow-up in 4 weeks. I did provided her with referral to [facility] for weight loss and control of diabetes. Check UA and urine for microalbumin. CMP-for baseline CBC-for baseline

## 2016-12-01 ENCOUNTER — Telehealth: Payer: Self-pay | Admitting: General Practice

## 2016-12-01 ENCOUNTER — Telehealth: Payer: Self-pay

## 2016-12-01 LAB — CMP14 + ANION GAP
ALT: 24 IU/L (ref 0–32)
ANION GAP: 14 mmol/L (ref 10.0–18.0)
AST: 25 IU/L (ref 0–40)
Albumin/Globulin Ratio: 1.7 (ref 1.2–2.2)
Albumin: 4 g/dL (ref 3.5–5.5)
Alkaline Phosphatase: 84 IU/L (ref 39–117)
BILIRUBIN TOTAL: 0.3 mg/dL (ref 0.0–1.2)
BUN/Creatinine Ratio: 21 (ref 9–23)
BUN: 14 mg/dL (ref 6–24)
CO2: 24 mmol/L (ref 18–29)
CREATININE: 0.66 mg/dL (ref 0.57–1.00)
Calcium: 9.2 mg/dL (ref 8.7–10.2)
Chloride: 101 mmol/L (ref 96–106)
GFR calc Af Amer: 122 mL/min/{1.73_m2} (ref >59)
GFR calc non Af Amer: 106 mL/min/{1.73_m2} (ref >59)
GLUCOSE: 180 mg/dL — AB (ref 65–99)
Globulin, Total: 2.3 g/dL (ref 1.5–4.5)
Potassium: 4.1 mmol/L (ref 3.5–5.2)
Sodium: 139 mmol/L (ref 134–144)
Total Protein: 6.3 g/dL (ref 6.0–8.5)

## 2016-12-01 LAB — LIPID PANEL
CHOLESTEROL TOTAL: 231 mg/dL — AB (ref 100–199)
Chol/HDL Ratio: 5 ratio — ABNORMAL HIGH (ref 0.0–4.4)
HDL: 46 mg/dL (ref >39)
LDL CALC: 162 mg/dL — AB (ref 0–99)
Triglycerides: 116 mg/dL (ref 0–149)
VLDL CHOLESTEROL CAL: 23 mg/dL (ref 5–40)

## 2016-12-01 LAB — URINALYSIS, ROUTINE W REFLEX MICROSCOPIC
Bilirubin, UA: NEGATIVE
Ketones, UA: NEGATIVE
Leukocytes, UA: NEGATIVE
NITRITE UA: NEGATIVE
PH UA: 5 (ref 5.0–7.5)
Protein, UA: NEGATIVE
RBC, UA: NEGATIVE
Specific Gravity, UA: 1.025 (ref 1.005–1.030)
UUROB: 0.2 mg/dL (ref 0.2–1.0)

## 2016-12-01 LAB — MICROALBUMIN / CREATININE URINE RATIO
Creatinine, Urine: 110.1 mg/dL
Microalbumin, Urine: <3 ug/mL

## 2016-12-01 LAB — CBC WITH DIFFERENTIAL/PLATELET
BASOS: 0 %
Basophils Absolute: 0 10*3/uL (ref 0.0–0.2)
EOS (ABSOLUTE): 0.1 10*3/uL (ref 0.0–0.4)
EOS: 1 %
HEMATOCRIT: 37.5 % (ref 34.0–46.6)
Hemoglobin: 13 g/dL (ref 11.1–15.9)
Immature Grans (Abs): 0 10*3/uL (ref 0.0–0.1)
Immature Granulocytes: 0 %
LYMPHS ABS: 1.3 10*3/uL (ref 0.7–3.1)
Lymphs: 17 %
MCH: 30 pg (ref 26.6–33.0)
MCHC: 34.7 g/dL (ref 31.5–35.7)
MCV: 87 fL (ref 79–97)
MONOS ABS: 0.4 10*3/uL (ref 0.1–0.9)
Monocytes: 6 %
NEUTROS ABS: 5.6 10*3/uL (ref 1.4–7.0)
Neutrophils: 76 %
Platelets: 353 10*3/uL (ref 150–379)
RBC: 4.33 x10E6/uL (ref 3.77–5.28)
RDW: 14.2 % (ref 12.3–15.4)
WBC: 7.4 10*3/uL (ref 3.4–10.8)

## 2016-12-01 NOTE — Progress Notes (Signed)
Internal Medicine Clinic Attending  Case discussed with Dr. Amin at the time of the visit.  We reviewed the resident's history and exam and pertinent patient test results.  I agree with the assessment, diagnosis, and plan of care documented in the resident's note.    

## 2016-12-01 NOTE — Telephone Encounter (Signed)
Pt stated she was told by [facility], Januvia needs a PA. [facility].

## 2016-12-01 NOTE — Telephone Encounter (Signed)
Dr Nelson ChimesAmin can u call pt agin w/results? Thanks

## 2016-12-01 NOTE — Telephone Encounter (Signed)
PA for Januvia per [facility].  Unable to com .   Call to patient to verify what medications she has been on for her Diabetes.  Message was left for patient to call the Clinics so that the PA requeset can be completed.  Angelina OkGladys Aryan Bello, RN 12/01/2016 2:14 PM.

## 2016-12-01 NOTE — Telephone Encounter (Signed)
Requesting PA on sitaGLIPtin (JANUVIA) 25 MG tablet per patient. Pt is asking to speak with Myriam JacobsonHelen for meds.

## 2016-12-01 NOTE — Telephone Encounter (Signed)
AMIN LEFT MESSAGE COULD NOT UNDERSTAND HER

## 2016-12-02 NOTE — Telephone Encounter (Signed)
I called again, and after another message.

## 2016-12-08 NOTE — Addendum Note (Signed)
Addended by: Neomia DearPOWERS, Chaselyn Nanney E on: 12/08/2016 07:49 PM   Modules accepted: Orders

## 2016-12-10 ENCOUNTER — Other Ambulatory Visit: Payer: Self-pay | Admitting: Internal Medicine

## 2016-12-11 ENCOUNTER — Telehealth: Payer: Self-pay | Admitting: *Deleted

## 2016-12-11 NOTE — Telephone Encounter (Signed)
Pt called and from verbal report spoke to front office in a very inappropriate manner. She immediately began speaking to triage nurse in a loud intimidating tone, demanding a refill of tizanidine. I ask if [facility] or would she be transferring care, she states SHE IS MOVING BACK TO THE [place] OF [place] 12/23/2016. She used inappropriate language in referring to the md that [facility].  She also stated she is suing [facility] and [facility] for not approving her refills I did tell her I would ask and did not know that it would be approved or not for a 2 week supply of tizanidine but after that time there would be no more refills and as she has stated she would need to seek other arrangements for medical care. She said ok and hung up. Would you consider a 2 week fill of tizanidine?

## 2016-12-11 NOTE — Telephone Encounter (Signed)
[facility]. Was not on Oct D/C summary. Methocarbamol was on [facility] med list but [facility]. [facility] but no mention of muscle relaxer. Since we have NEVER Rx'd Tizandine, or any muscle relaxer, will need appt to assess appropriateness of this med. Thanks

## 2016-12-11 NOTE — Telephone Encounter (Signed)
Called pt back, got no answer but lm for rtc and possible appt this pm

## 2016-12-14 NOTE — Telephone Encounter (Signed)
No answer when called 

## 2016-12-16 NOTE — Telephone Encounter (Signed)
No answer

## 2016-12-31 ENCOUNTER — Encounter: Payer: Medicaid Other | Admitting: Internal Medicine

## 2017-02-10 ENCOUNTER — Other Ambulatory Visit: Payer: Self-pay | Admitting: Adult Health

## 2017-05-06 IMAGING — RF DG ESOPHAGUS
4 series · 15 of 15 positions shown · non-contrast
Comparison: None.

CLINICAL DATA: Dysphagia

EXAM:
ESOPHOGRAM / BARIUM SWALLOW / BARIUM TABLET STUDY
TECHNIQUE: Combined double contrast and single contrast examination performed
using effervescent crystals, thick barium liquid, and thin barium
liquid. The patient was observed with fluoroscopy swallowing a 13 mm
barium sulphate tablet.
FLUOROSCOPY TIME:  Fluoroscopy Time:  1.8 minutes
Radiation Exposure Index (if provided by the fluoroscopic device):
26.8 mGy
Number of Acquired Spot Images: 0

[Series 1: cp_standard · 0.53mm/px · 3 of 92 frames shown (1 of 4)]
[frame 14/92]
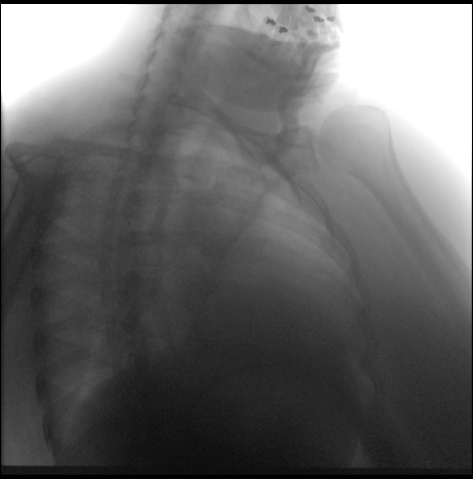
[frame 47/92]
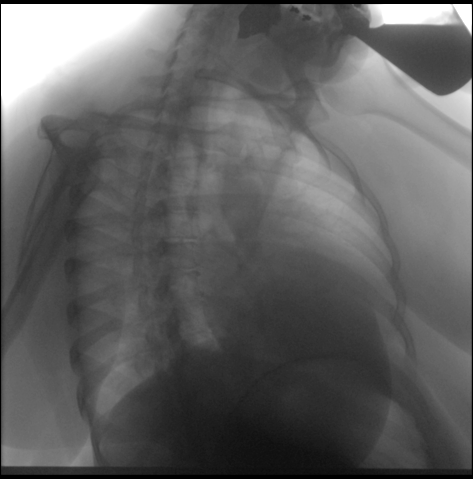
[frame 79/92]
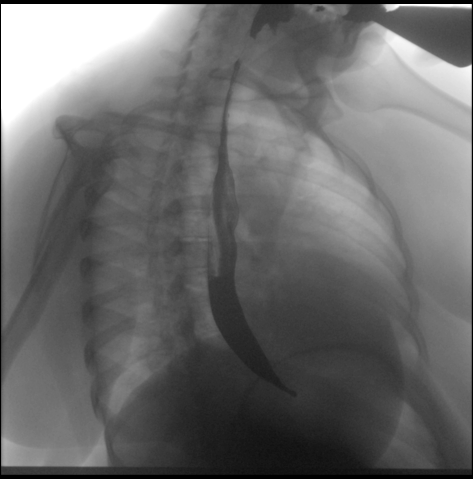

[Series 2: cp_standard · 0.37mm/px · 4 of 54 frames shown (2 of 4)]
[frame 9/54]
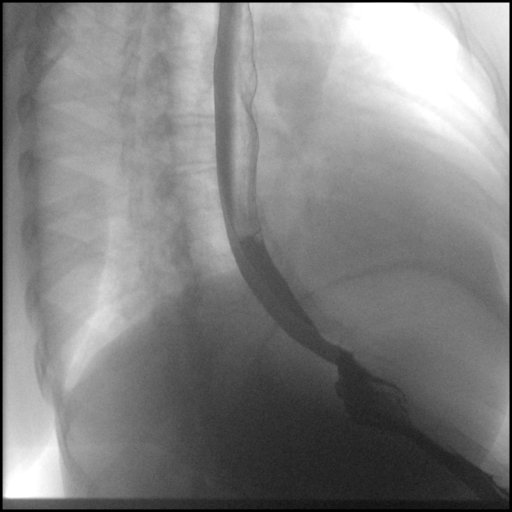
[frame 12/54]
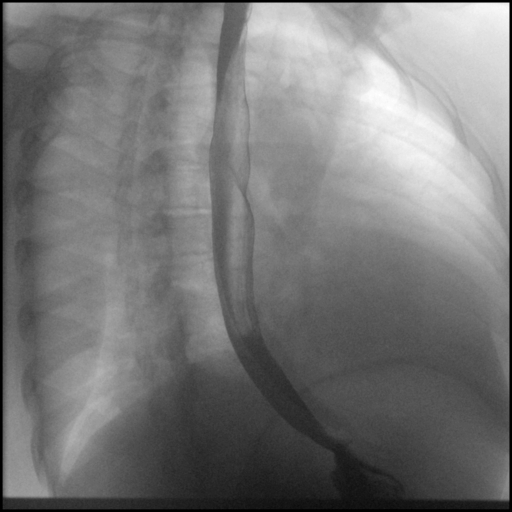
[frame 28/54]
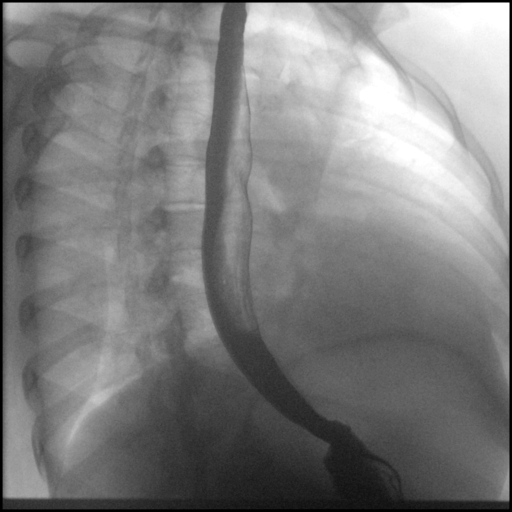
[frame 46/54]
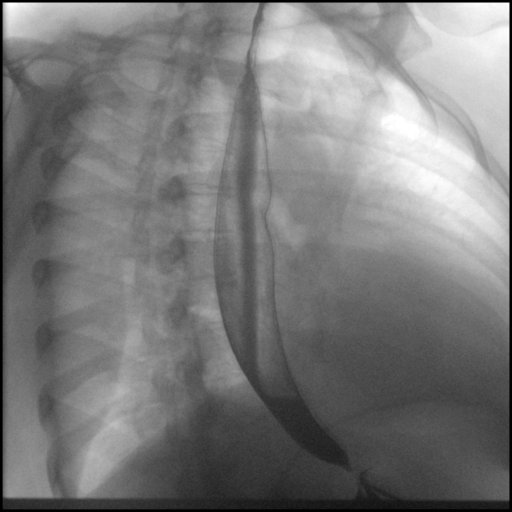

[Series 3: cp_standard · 0.37mm/px · 4 of 61 frames shown (3 of 4)]
[frame 7/61]
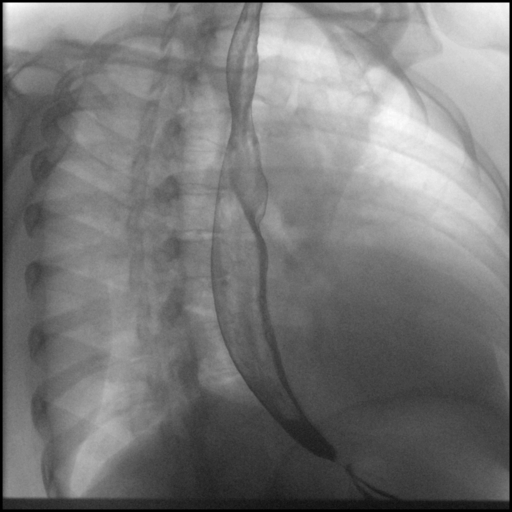
[frame 10/61]
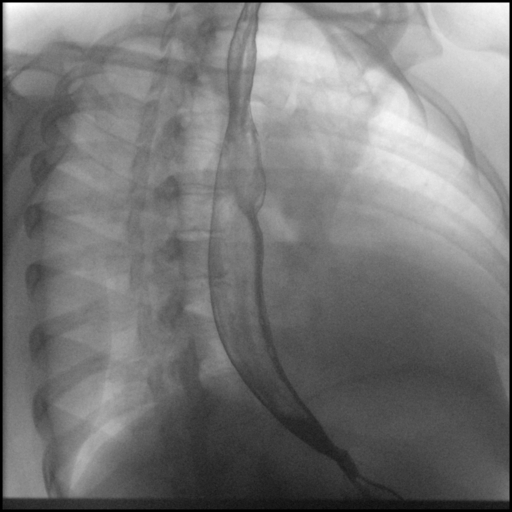
[frame 31/61]
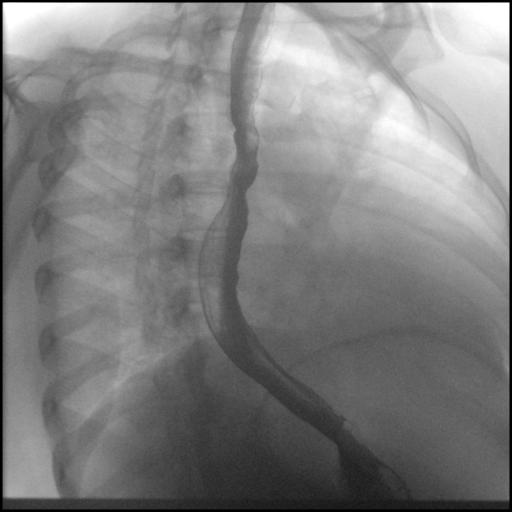
[frame 52/61]
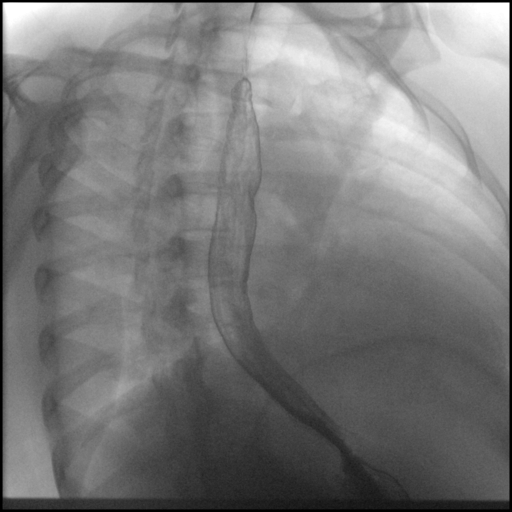

[Series 4: cp_standard · 0.39mm/px · 4 of 197 frames shown (4 of 4)]
[frame 30/197]
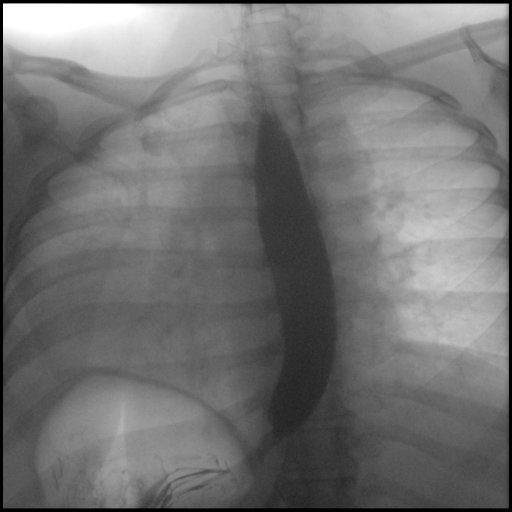
[frame 99/197]
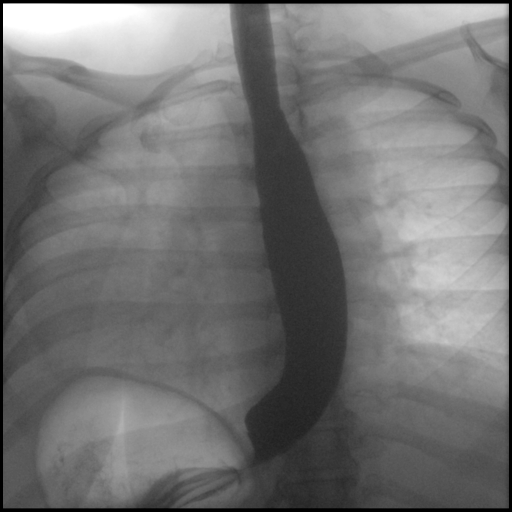
[frame 168/197]
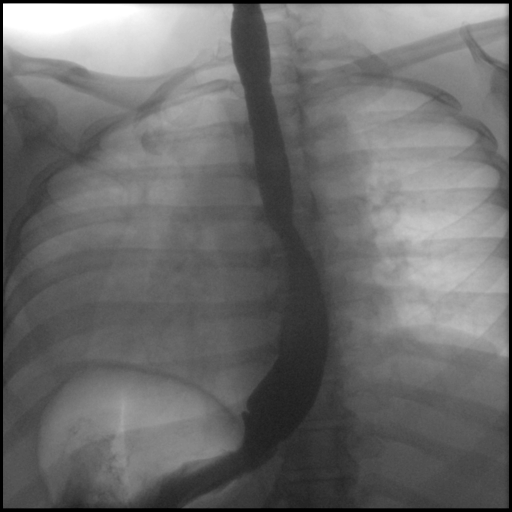
[frame 188/197]
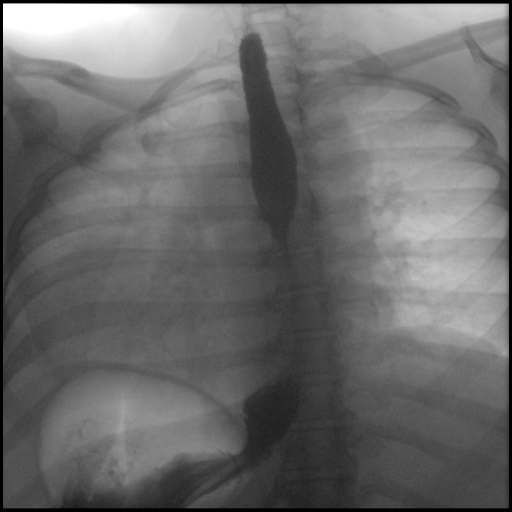

[15 of 15 positions shown; findings below may reference images not displayed]

FINDINGS: Fluoroscopic evaluation of swallowing demonstrates normal esophageal
peristalsis. No fixed stricture, fold thickening or mass. Small
hiatal hernia noted. No reflux with the water siphon maneuver. The
patient swallowed a 13 mm barium tablet which freely passed into the
stomach.
IMPRESSION: Small hiatal hernia.  Otherwise unremarkable study.

## 2018-03-16 NOTE — Telephone Encounter (Signed)
Review of meds
